# Patient Record
Sex: Male | Born: 1995 | Race: White | Hispanic: No | Marital: Single | Smoking: Former smoker
Health system: Southern US, Community
[De-identification: ages and names within clinical notes are randomized; demographics above are authoritative.]

## PROBLEM LIST (undated history)

## (undated) DIAGNOSIS — F319 Bipolar disorder, unspecified: Secondary | ICD-10-CM

## (undated) DIAGNOSIS — J069 Acute upper respiratory infection, unspecified: Secondary | ICD-10-CM

## (undated) DIAGNOSIS — J45909 Unspecified asthma, uncomplicated: Secondary | ICD-10-CM

## (undated) DIAGNOSIS — F909 Attention-deficit hyperactivity disorder, unspecified type: Secondary | ICD-10-CM

## (undated) DIAGNOSIS — T7840XA Allergy, unspecified, initial encounter: Secondary | ICD-10-CM

## (undated) HISTORY — DX: Acute upper respiratory infection, unspecified: J06.9

## (undated) HISTORY — DX: Allergy, unspecified, initial encounter: T78.40XA

---
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
## 2015-03-05 ENCOUNTER — Encounter (HOSPITAL_BASED_OUTPATIENT_CLINIC_OR_DEPARTMENT_OTHER): Payer: Self-pay

## 2015-03-05 ENCOUNTER — Emergency Department (HOSPITAL_BASED_OUTPATIENT_CLINIC_OR_DEPARTMENT_OTHER)
Admission: EM | Admit: 2015-03-05 | Discharge: 2015-03-05 | Disposition: A | Discharge status: Home / Self Care | Payer: Medicaid Other | Attending: Emergency Medicine | Admitting: Emergency Medicine

## 2015-03-05 ENCOUNTER — Emergency Department (HOSPITAL_BASED_OUTPATIENT_CLINIC_OR_DEPARTMENT_OTHER): Payer: Medicaid Other

## 2015-03-05 DIAGNOSIS — S060X1A Concussion with loss of consciousness of 30 minutes or less, initial encounter: Secondary | ICD-10-CM

## 2015-03-05 DIAGNOSIS — S022XXA Fracture of nasal bones, initial encounter for closed fracture: Secondary | ICD-10-CM

## 2015-03-05 DIAGNOSIS — Y929 Unspecified place or not applicable: Secondary | ICD-10-CM | POA: Insufficient documentation

## 2015-03-05 DIAGNOSIS — J45909 Unspecified asthma, uncomplicated: Secondary | ICD-10-CM | POA: Diagnosis not present

## 2015-03-05 DIAGNOSIS — W228XXA Striking against or struck by other objects, initial encounter: Secondary | ICD-10-CM | POA: Diagnosis not present

## 2015-03-05 DIAGNOSIS — S0990XA Unspecified injury of head, initial encounter: Secondary | ICD-10-CM | POA: Diagnosis present

## 2015-03-05 DIAGNOSIS — Z79899 Other long term (current) drug therapy: Secondary | ICD-10-CM | POA: Insufficient documentation

## 2015-03-05 DIAGNOSIS — S02401A Maxillary fracture, unspecified, initial encounter for closed fracture: Secondary | ICD-10-CM

## 2015-03-05 DIAGNOSIS — Y9383 Activity, rough housing and horseplay: Secondary | ICD-10-CM | POA: Diagnosis not present

## 2015-03-05 DIAGNOSIS — Y998 Other external cause status: Secondary | ICD-10-CM | POA: Insufficient documentation

## 2015-03-05 DIAGNOSIS — F909 Attention-deficit hyperactivity disorder, unspecified type: Secondary | ICD-10-CM | POA: Insufficient documentation

## 2015-03-05 MED ORDER — ONDANSETRON HCL 4 MG/2ML IJ SOLN
4.0000 mg | Freq: Once | INTRAMUSCULAR | Status: AC
  Administered 2015-03-05: 4 mg via INTRAVENOUS
  Filled 2015-03-05: qty 2

## 2015-03-05 MED ORDER — SODIUM CHLORIDE 0.9 % IV BOLUS (SEPSIS)
500.0000 mL | Freq: Once | INTRAVENOUS | Status: AC
  Administered 2015-03-05: 500 mL via INTRAVENOUS

## 2015-03-05 MED ORDER — FUROSEMIDE 10 MG/ML IJ SOLN: 40.0000 mg | Freq: Once | INTRAMUSCULAR | Status: DC

## 2015-03-05 MED ORDER — MORPHINE SULFATE 4 MG/ML IJ SOLN
4.0000 mg | INTRAMUSCULAR | Status: DC | PRN
  Administered 2015-03-05: 4 mg via INTRAVENOUS
  Filled 2015-03-05: qty 1

## 2015-03-05 MED ORDER — OXYCODONE-ACETAMINOPHEN 5-325 MG PO TABS
1.0000 | ORAL_TABLET | Freq: Once | ORAL | Status: AC
  Administered 2015-03-05: 1 via ORAL
  Filled 2015-03-05: qty 1

## 2015-03-05 MED ORDER — HYDROCODONE-ACETAMINOPHEN 5-325 MG PO TABS: ORAL_TABLET | ORAL | Status: DC
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
## 2015-03-05 NOTE — Discharge Instructions (Signed)
Take vicodin for breakthrough pain, do not drink alcohol, drive, care for children or do other critical tasks while taking vicodin.  Please follow with your primary care doctor in the next 2 days for a check-up. They must obtain records for further management.   Do not hesitate to return to the Emergency Department for any new, worsening or concerning symptoms.   Do not participate in any sports or any activities that could result in head trauma until you are cleared by your pediatrician,  primary care physician or neurologist.    Facial Fracture A facial fracture is a break in one of the bones of your face. HOME CARE INSTRUCTIONS   Protect the injured part of your face until it is healed.  Do not participate in activities which give chance for re-injury until your doctor approves.  Gently wash and dry your face.  Wear head and facial protection while riding a bicycle, motorcycle, or snowmobile. SEEK MEDICAL CARE IF:   An oral temperature above 102 F (38.9 C) develops.  You have severe headaches or notice changes in your vision.  You have new numbness or tingling in your face.  You develop nausea (feeling sick to your stomach), vomiting or a stiff neck. SEEK IMMEDIATE MEDICAL CARE IF:   You develop difficulty seeing or experience double vision.  You become dizzy, lightheaded, or faint.  You develop trouble speaking, breathing, or swallowing.  You have a watery discharge from your nose or ear. MAKE SURE YOU:   Understand these instructions.  Will watch your condition.  Will get help right away if you are not doing well or get worse. Document Released: 07/27/2005 Document Revised: 10/19/2011 Document Reviewed: 03/15/2008 [facility] Patient Information 2015 [place], [place]. This information is not intended to replace advice given to you by your health care provider. Make sure you discuss any questions you have with your health care provider.
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
## 2015-03-05 NOTE — ED Notes (Signed)
pa at bedside. 

## 2015-03-05 NOTE — ED Notes (Signed)
Family at bedside. 

## 2019-10-20 ENCOUNTER — Encounter (HOSPITAL_COMMUNITY): Payer: Self-pay

## 2019-10-20 ENCOUNTER — Ambulatory Visit (HOSPITAL_COMMUNITY)
Admission: EM | Admit: 2019-10-20 | Discharge: 2019-10-20 | Disposition: A | Discharge status: Home / Self Care | Payer: Medicaid Other | Attending: Physician Assistant | Admitting: Physician Assistant

## 2019-10-20 ENCOUNTER — Other Ambulatory Visit: Payer: Self-pay

## 2019-10-20 DIAGNOSIS — Z20822 Contact with and (suspected) exposure to covid-19: Secondary | ICD-10-CM | POA: Diagnosis not present

## 2019-10-20 DIAGNOSIS — F909 Attention-deficit hyperactivity disorder, unspecified type: Secondary | ICD-10-CM | POA: Diagnosis not present

## 2019-10-20 DIAGNOSIS — Z79899 Other long term (current) drug therapy: Secondary | ICD-10-CM | POA: Diagnosis not present

## 2019-10-20 DIAGNOSIS — J069 Acute upper respiratory infection, unspecified: Secondary | ICD-10-CM | POA: Diagnosis present

## 2019-10-20 DIAGNOSIS — J029 Acute pharyngitis, unspecified: Secondary | ICD-10-CM | POA: Diagnosis not present

## 2019-10-20 DIAGNOSIS — R0789 Other chest pain: Secondary | ICD-10-CM | POA: Diagnosis not present

## 2019-10-20 DIAGNOSIS — R05 Cough: Secondary | ICD-10-CM | POA: Insufficient documentation

## 2019-10-20 DIAGNOSIS — L259 Unspecified contact dermatitis, unspecified cause: Secondary | ICD-10-CM

## 2019-10-20 MED ORDER — CETIRIZINE HCL 10 MG PO TABS: 10.0000 mg | ORAL_TABLET | Freq: Every day | ORAL | 0 refills | Status: DC

## 2019-10-20 MED ORDER — ALBUTEROL SULFATE HFA 108 (90 BASE) MCG/ACT IN AERS: 2.0000 | INHALATION_SPRAY | RESPIRATORY_TRACT | 0 refills | Status: DC | PRN

## 2019-10-20 MED ORDER — BENZONATATE 100 MG PO CAPS: 100.0000 mg | ORAL_CAPSULE | Freq: Three times a day (TID) | ORAL | 0 refills | Status: DC

## 2019-10-20 NOTE — Discharge Instructions (Signed)
Take the [facility] for your cough.  You may also utilize your albuterol inhaler for chest tightness and feelings of shortness of breath every 4-6 hours.  Utilize Tylenol for fever, body aches, chills.  If you have worsening shortness of breath, wheezing, change in your productive cough and fever please return for reevaluation.  If your Covid-19 test is positive, you will receive a phone call from [facility] regarding your results. Negative test results are not called. Both positive and negative results area always visible on MyChart. If you do not have a MyChart account, sign up instructions are in your discharge papers.   Persons who are directed to care for themselves at home may discontinue isolation under the following conditions:   At least 10 days have passed since symptom onset and  At least 24 hours have passed without running a fever (this means without the use of fever-reducing medications) and  Other symptoms have improved.  Persons infected with COVID-19 who never develop symptoms may discontinue isolation and other precautions 10 days after the date of their first positive COVID-19 test.

## 2019-10-20 NOTE — ED Triage Notes (Signed)
Pt presents with productive cough , sore throat and chills since yesterday.

## 2019-10-20 NOTE — ED Provider Notes (Addendum)
[facility]    CSN: [id number] Arrival date & time: 10/20/19  1013      History   Chief Complaint Chief Complaint  Patient presents with  . Cough  . Sore Throat  . Chills    HPI Lee Ramos is a 24 y.o. male.   Patient presents for 1 day history of onset of cough, sore throat and chills.  He reports last night he began having a productive cough with clear sputum.  He also had mildly sore throat.  He also been experiencing chest tightness but denies difficulty breathing or shortness of breath.  Denies chest pain.  An episode of chills.  He denies fever.  Denies nausea, vomiting, diarrhea.  Denies known sick contacts.  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  He does endorse a history of asthma that he uses an albuterol inhaler for.  He has not had to use this increasingly in the last 2 days.     Past Medical History:  Diagnosis Date  . ADHD (attention deficit hyperactivity disorder)   . Allergy   . Allergy   . Asthma   . Bipolar 1 disorder [facility])     Patient Active Problem List   Diagnosis Date Noted  . Attention deficit disorder with hyperactivity(314.01) 11/01/2012  . Bipolar disorder, unspecified (HCC) 11/01/2012  . Unspecified asthma(493.90) 11/01/2012    History reviewed. No pertinent surgical history.     Home Medications    Prior to Admission medications   Medication Sig Start Date End Date Taking? Authorizing Provider  albuterol (VENTOLIN HFA) 108 (90 Base) MCG/ACT inhaler Inhale 2 puffs into the lungs every 4 (four) hours as needed for shortness of breath. For shortness of breath 10/20/19   Soila Printup, Veryl Speak, PA-C  benzonatate (TESSALON) 100 MG capsule Take 1 capsule (100 mg total) by mouth every 8 (eight) hours. 10/20/19   Kissa Campoy, Veryl Speak, PA-C  cetirizine (ZYRTEC) 10 MG tablet Take 1 tablet (10 mg total) by mouth daily. 10/20/19   Darragh Nay, Veryl Speak, PA-C  dexamethasone (DECADRON) 4 MG tablet Take 4 tablets (16 mg total) by mouth  once. 01/16/13   Raeford Razor, MD  diphenhydrAMINE (BENADRYL) 2 % cream Apply topically 3 (three) times daily as needed for itching. 11/01/12   Laurell Josephs, MD  guanFACINE (INTUNIV) 4 MG TB24 Take 1 tablet (4 mg total) by mouth daily. 03/03/13   Laurell Josephs, MD  HYDROcodone-acetaminophen (NORCO/VICODIN) 5-325 MG per tablet Take 1-2 tablets by mouth every 6 hours as needed for pain and/or cough. 03/05/15   Pisciotta, Joni Reining, PA-C  methylphenidate (CONCERTA) 54 MG CR tablet Take 1 tablet (54 mg total) by mouth every morning. 03/03/13   Laurell Josephs, MD    Family History Family History  Family history unknown: Yes    Social History Social History   Tobacco Use  . Smoking status: Never Smoker  Substance Use Topics  . Alcohol use: No  . Drug use: No     Allergies   Nickel   Review of Systems Review of Systems  Constitutional: Positive for chills. Negative for fever.  HENT: Positive for rhinorrhea. Negative for ear pain and sore throat.   Eyes: Negative for pain, itching and visual disturbance.  Respiratory: Positive for cough and chest tightness. Negative for shortness of breath.   Cardiovascular: Negative for chest pain and palpitations.  Gastrointestinal: Negative for abdominal pain, diarrhea, nausea and vomiting.  Musculoskeletal: Negative for arthralgias, back pain and  myalgias.  Skin: Negative for color change and rash.  Neurological: Negative for headaches.  All other systems reviewed and are negative.    Physical Exam Triage Vital Signs ED Triage Vitals  Enc Vitals Group     BP      Pulse      Resp      Temp      Temp src      SpO2      Weight      Height      Head Circumference      Peak Flow      Pain Score      Pain Loc      Pain Edu?      Excl. in GC?    No data found.  Updated Vital Signs BP 119/81 (BP Location: Right Arm)   Pulse 77   Temp 98.3 F (36.8 C) (Oral)   Resp 18   SpO2 100%   Visual Acuity Right Eye Distance:   Left  Eye Distance:   Bilateral Distance:    Right Eye Near:   Left Eye Near:    Bilateral Near:     Physical Exam Vitals and nursing note reviewed.  Constitutional:      General: He is not in acute distress.    Appearance: He is well-developed. He is not ill-appearing.  HENT:     Head: Normocephalic and atraumatic.     Right Ear: Tympanic membrane normal.     Left Ear: Tympanic membrane normal.     Mouth/Throat:     Mouth: Mucous membranes are moist.     Pharynx: Oropharynx is clear.  Eyes:     Conjunctiva/sclera: Conjunctivae normal.  Cardiovascular:     Rate and Rhythm: Normal rate and regular rhythm.     Heart sounds: No murmur.  Pulmonary:     Effort: Pulmonary effort is normal. No respiratory distress.     Breath sounds: Normal breath sounds. No wheezing, rhonchi or rales.  Abdominal:     Palpations: Abdomen is soft.     Tenderness: There is no abdominal tenderness.  Musculoskeletal:     Cervical back: Neck supple.  Skin:    General: Skin is warm and dry.  Neurological:     Mental Status: He is alert.      UC Treatments / Results  Labs (all labs ordered are listed, but only abnormal results are displayed) Labs Reviewed  SARS CORONAVIRUS 2 (TAT 6-24 HRS)    EKG   Radiology No results found.  Procedures Procedures (including critical care time)  Medications Ordered in UC Medications - No data to display  Initial Impression / Assessment and Plan / UC Course  I have reviewed the triage vital signs and the nursing notes.  Pertinent labs & imaging results that were available during my care of the patient were reviewed by me and considered in my medical decision making (see chart for details).     #URI with cough Patient is a 24 year old male presenting with viral syndrome with cough.  Covid PCR was sent.  Symptomatic care with Tessalon for cough, Tylenol for sore throat and fever.  No wheezing saturating 100% moving air well however, instructed to utilize  albuterol inhaler every 4-6 hours scheduled for chest tightness.  Ordered Zyrtec to take daily and discussed this with patient. -Patient verbalized understanding plan and work note was given. Final Clinical Impressions(s) / UC Diagnoses   Final diagnoses:  Viral URI with cough  Discharge Instructions     Take the [facility] for your cough.  You may also utilize your albuterol inhaler for chest tightness and feelings of shortness of breath every 4-6 hours.  Utilize Tylenol for fever, body aches, chills.  If you have worsening shortness of breath, wheezing, change in your productive cough and fever please return for reevaluation.  If your Covid-19 test is positive, you will receive a phone call from [facility] regarding your results. Negative test results are not called. Both positive and negative results area always visible on MyChart. If you do not have a MyChart account, sign up instructions are in your discharge papers.   Persons who are directed to care for themselves at home may discontinue isolation under the following conditions:  . At least 10 days have passed since symptom onset and . At least 24 hours have passed without running a fever (this means without the use of fever-reducing medications) and . Other symptoms have improved.  Persons infected with COVID-19 who never develop symptoms may discontinue isolation and other precautions 10 days after the date of their first positive COVID-19 test.     ED Prescriptions    Medication Sig Dispense Auth. Provider   albuterol (VENTOLIN HFA) 108 (90 Base) MCG/ACT inhaler Inhale 2 puffs into the lungs every 4 (four) hours as needed for shortness of breath. For shortness of breath 18 g Tynesha Free, Marguerita Beards, PA-C   cetirizine (ZYRTEC) 10 MG tablet Take 1 tablet (10 mg total) by mouth daily. 30 tablet Alicya Bena, Marguerita Beards, PA-C   benzonatate (TESSALON) 100 MG capsule Take 1 capsule (100 mg total) by mouth every 8 (eight) hours. 21  capsule Jedadiah Abdallah, Marguerita Beards, PA-C     PDMP not reviewed this encounter.   Purnell Shoemaker, PA-C 10/20/19 1531    Lenni Reckner, Marguerita Beards, PA-C 10/20/19 [phone]

## 2019-10-21 LAB — SARS CORONAVIRUS 2 (TAT 6-24 HRS): SARS Coronavirus 2: NEGATIVE

## 2019-12-03 ENCOUNTER — Other Ambulatory Visit: Payer: Self-pay

## 2019-12-03 ENCOUNTER — Emergency Department (HOSPITAL_COMMUNITY)
Admission: EM | Admit: 2019-12-03 | Discharge: 2019-12-03 | Disposition: A | Discharge status: Home / Self Care | Payer: Self-pay | Attending: Emergency Medicine | Admitting: Emergency Medicine

## 2019-12-03 ENCOUNTER — Encounter (HOSPITAL_COMMUNITY): Payer: Self-pay | Admitting: Emergency Medicine

## 2019-12-03 ENCOUNTER — Emergency Department (HOSPITAL_COMMUNITY): Payer: Self-pay

## 2019-12-03 DIAGNOSIS — J45909 Unspecified asthma, uncomplicated: Secondary | ICD-10-CM | POA: Insufficient documentation

## 2019-12-03 DIAGNOSIS — R079 Chest pain, unspecified: Secondary | ICD-10-CM

## 2019-12-03 DIAGNOSIS — R0789 Other chest pain: Secondary | ICD-10-CM | POA: Insufficient documentation

## 2019-12-03 LAB — BASIC METABOLIC PANEL
Anion gap: 5 (ref 5–15)
BUN: 9 mg/dL (ref 6–20)
CO2: 29 mmol/L (ref 22–32)
Calcium: 9.3 mg/dL (ref 8.9–10.3)
Chloride: 105 mmol/L (ref 98–111)
Creatinine, Ser: 0.97 mg/dL (ref 0.61–1.24)
GFR calc Af Amer: >60 mL/min (ref >60)
GFR calc non Af Amer: >60 mL/min (ref >60)
Glucose, Bld: 78 mg/dL (ref 70–99)
Potassium: 4.6 mmol/L (ref 3.5–5.1)
Sodium: 139 mmol/L (ref 135–145)

## 2019-12-03 LAB — CBC
HCT: 47.9 % (ref 39.0–52.0)
Hemoglobin: 15.4 g/dL (ref 13.0–17.0)
MCH: 29.2 pg (ref 26.0–34.0)
MCHC: 32.2 g/dL (ref 30.0–36.0)
MCV: 90.7 fL (ref 80.0–100.0)
Platelets: 286 10*3/uL (ref 150–400)
RBC: 5.28 MIL/uL (ref 4.22–5.81)
RDW: 12.5 % (ref 11.5–15.5)
WBC: 7.4 10*3/uL (ref 4.0–10.5)
nRBC: 0 % (ref 0.0–0.2)

## 2019-12-03 LAB — TROPONIN I (HIGH SENSITIVITY)
Troponin I (High Sensitivity): <2 ng/L (ref <18)
Troponin I (High Sensitivity): <2 ng/L (ref <18)

## 2019-12-03 MED ORDER — SODIUM CHLORIDE 0.9% FLUSH: 3.0000 mL | Freq: Once | INTRAVENOUS | Status: DC

## 2019-12-03 NOTE — Discharge Instructions (Signed)
Use ibuprofen or tylenol as needed for pain. Return to the ER for new or worsening symptoms.

## 2019-12-03 NOTE — ED Triage Notes (Signed)
Pt to ED via [facility] with c/o mid chest pain.  Pt st's has been under a lot of stress.  St's he was cleaning his car and was bent over and when he stood up he developed chest pain.  Pt was given ASA 324mg 

## 2019-12-03 NOTE — ED Provider Notes (Signed)
[facility] EMERGENCY DEPARTMENT Provider Note   CSN: [id number] Arrival date & time: 12/03/19  1623     History Chief Complaint  Patient presents with  . Chest Pain    Lee Ramos is a 24 y.o. male.  Patient with history of asthma presents with left-sided chest pain worse with palpation and movement since he was cleaning his car.  Patient denies any cardiac history.  Patient denies illegal drugs.  No exertional symptoms.  No history of similar.  Fairly constant and was given aspirin by EMS.  Pain has improved significantly since then.  Patient denies blood clot risk factors.        Past Medical History:  Diagnosis Date  . ADHD (attention deficit hyperactivity disorder)   . Allergy   . Allergy   . Asthma   . Bipolar 1 disorder [facility])     Patient Active Problem List   Diagnosis Date Noted  . Attention deficit disorder with hyperactivity(314.01) 11/01/2012  . Bipolar disorder, unspecified (HCC) 11/01/2012  . Unspecified asthma(493.90) 11/01/2012    History reviewed. No pertinent surgical history.     Family History  Family history unknown: Yes    Social History   Tobacco Use  . Smoking status: Never Smoker  . Smokeless tobacco: Never Used  Substance Use Topics  . Alcohol use: No  . Drug use: No    Home Medications Prior to Admission medications   Medication Sig Start Date End Date Taking? Authorizing Provider  albuterol (PROVENTIL) (2.5 MG/3ML) 0.083% nebulizer solution Take 2.5 mg by nebulization as needed for wheezing.  12/13/17  Yes [provider]  albuterol (VENTOLIN HFA) 108 (90 Base) MCG/ACT inhaler Inhale 2 puffs into the lungs every 4 (four) hours as needed for shortness of breath. For shortness of breath 10/20/19  Yes Darr, Veryl Speak, PA-C  cetirizine (ZYRTEC) 10 MG tablet Take 1 tablet (10 mg total) by mouth daily. 10/20/19  Yes Darr, Veryl Speak, PA-C  benzonatate (TESSALON) 100 MG capsule Take 1 capsule (100 mg total) by  mouth every 8 (eight) hours. Patient not taking: Reported on 12/03/2019 10/20/19   Darr, Veryl Speak, PA-C  dexamethasone (DECADRON) 4 MG tablet Take 4 tablets (16 mg total) by mouth once. Patient not taking: Reported on 12/03/2019 01/16/13   Raeford Razor, MD  diphenhydrAMINE (BENADRYL) 2 % cream Apply topically 3 (three) times daily as needed for itching. Patient not taking: Reported on 12/03/2019 11/01/12   Laurell Josephs, MD  guanFACINE (INTUNIV) 4 MG TB24 Take 1 tablet (4 mg total) by mouth daily. Patient not taking: Reported on 12/03/2019 03/03/13   Laurell Josephs, MD  HYDROcodone-acetaminophen (NORCO/VICODIN) 5-325 MG per tablet Take 1-2 tablets by mouth every 6 hours as needed for pain and/or cough. Patient not taking: Reported on 12/03/2019 03/05/15   Pisciotta, Joni Reining, PA-C  methylphenidate (CONCERTA) 54 MG CR tablet Take 1 tablet (54 mg total) by mouth every morning. Patient not taking: Reported on 12/03/2019 03/03/13   Laurell Josephs, MD    Allergies    Pepto-bismol  [bismuth] and Nickel  Review of Systems   Review of Systems  Constitutional: Negative for chills and fever.  HENT: Negative for congestion.   Eyes: Negative for visual disturbance.  Respiratory: Negative for shortness of breath.   Cardiovascular: Positive for chest pain. Negative for leg swelling.  Gastrointestinal: Negative for abdominal pain and vomiting.  Genitourinary: Negative for dysuria and flank pain.  Musculoskeletal: Negative for back pain, neck pain  and neck stiffness.  Skin: Negative for rash.  Neurological: Negative for light-headedness and headaches.    Physical Exam Updated Vital Signs BP 104/66   Pulse 75   Temp 98.6 F (37 C) (Oral)   Resp (!) 23   Ht 5\' 11"  (1.803 m)   Wt 83.9 kg   SpO2 98%   BMI 25.80 kg/m   Physical Exam Vitals and nursing note reviewed.  Constitutional:      Appearance: He is well-developed.  HENT:     Head: Normocephalic and atraumatic.  Eyes:     General:         Right eye: No discharge.        Left eye: No discharge.     Conjunctiva/sclera: Conjunctivae normal.  Neck:     Trachea: No tracheal deviation.  Cardiovascular:     Rate and Rhythm: Normal rate and regular rhythm.  Pulmonary:     Effort: Pulmonary effort is normal.     Breath sounds: Normal breath sounds.  Abdominal:     General: There is no distension.     Palpations: Abdomen is soft.     Tenderness: There is no abdominal tenderness. There is no guarding.  Musculoskeletal:     Cervical back: Normal range of motion and neck supple.     Comments: Tender to palpation left anterior ribs  Skin:    General: Skin is warm.     Findings: No rash.  Neurological:     Mental Status: He is alert and oriented to person, place, and time.     ED Results / Procedures / Treatments   Labs (all labs ordered are listed, but only abnormal results are displayed) Labs Reviewed  BASIC METABOLIC PANEL  CBC  TROPONIN I (HIGH SENSITIVITY)  TROPONIN I (HIGH SENSITIVITY)    EKG EKG Interpretation  Date/Time:  Sunday December 03 2019 16:32:08 EDT Ventricular Rate:  72 PR Interval:  136 QRS Duration: 88 QT Interval:  360 QTC Calculation: 394 R Axis:   66 Text Interpretation: Normal sinus rhythm Early repolarization Normal ECG Confirmed by Elnora Morrison [id number]) on 12/03/2019 9:17:23 PM   Radiology DG Chest 2 View  Result Date: 12/03/2019 CLINICAL DATA:  Chest pain EXAM: CHEST - 2 VIEW COMPARISON:  06/30/2011 FINDINGS: The heart size and mediastinal contours are within normal limits. Both lungs are clear. The visualized skeletal structures are unremarkable. IMPRESSION: No acute abnormality of the lungs. Electronically Signed   By: Eddie Candle M.D.   On: 12/03/2019 17:10    Procedures Procedures (including critical care time)  Medications Ordered in ED Medications  sodium chloride flush (NS) 0.9 % injection 3 mL (3 mLs Intravenous Not Given 12/03/19 2114)    ED Course  I have reviewed the  triage vital signs and the nursing notes.  Pertinent labs & imaging results that were available during my care of the patient were reviewed by me and considered in my medical decision making (see chart for details).    MDM Rules/Calculators/A&P                      Patient presents with atypical chest pain likely musculoskeletal.  Patient is low risk ACS and very low risk blood clots.  General blood work ordered and reviewed normal white blood cell count, normal kidney function, normal initial troponin.  Delay in second troponin discussed with nursing staff.  Mother and patient wished to leave before second troponin result.  Patient does have capacity  make decisions and is very low risk at discharge.  Follow-up outpatient have been discussed.  EKG diffuse J-point elevation.  Chest x-ray reviewed no acute abnormalities.  Lee Ramos was evaluated in Emergency Department on 12/04/2019 for the symptoms described in the history of present illness. He was evaluated in the context of the global COVID-19 pandemic, which necessitated consideration that the patient might be at risk for infection with the SARS-CoV-2 virus that causes COVID-19. Institutional protocols and algorithms that pertain to the evaluation of patients at risk for COVID-19 are in a state of rapid change based on information released by regulatory bodies including the CDC and federal and state organizations. These policies and algorithms were followed during the patient's care in the ED.  Final Clinical Impression(s) / ED Diagnoses Final diagnoses:  Acute chest pain    Rx / DC Orders ED Discharge Orders    None       Blane Ohara, MD 12/04/19 [phone]

## 2021-06-18 ENCOUNTER — Other Ambulatory Visit: Payer: Self-pay

## 2021-06-18 ENCOUNTER — Emergency Department (HOSPITAL_COMMUNITY)
Admission: EM | Admit: 2021-06-18 | Discharge: 2021-06-18 | Disposition: A | Discharge status: Home / Self Care | Payer: Medicaid Other | Attending: Emergency Medicine | Admitting: Emergency Medicine

## 2021-06-18 ENCOUNTER — Encounter (HOSPITAL_COMMUNITY): Payer: Self-pay | Admitting: Emergency Medicine

## 2021-06-18 DIAGNOSIS — R0789 Other chest pain: Secondary | ICD-10-CM | POA: Insufficient documentation

## 2021-06-18 DIAGNOSIS — R079 Chest pain, unspecified: Secondary | ICD-10-CM | POA: Diagnosis present

## 2021-06-18 DIAGNOSIS — Z87891 Personal history of nicotine dependence: Secondary | ICD-10-CM | POA: Insufficient documentation

## 2021-06-18 DIAGNOSIS — J45909 Unspecified asthma, uncomplicated: Secondary | ICD-10-CM | POA: Diagnosis not present

## 2021-06-18 MED ORDER — IBUPROFEN 800 MG PO TABS: 800.0000 mg | ORAL_TABLET | Freq: Four times a day (QID) | ORAL | 0 refills | Status: DC | PRN

## 2021-06-18 MED ORDER — METHOCARBAMOL 500 MG PO TABS: 500.0000 mg | ORAL_TABLET | Freq: Three times a day (TID) | ORAL | 0 refills | Status: DC | PRN

## 2021-06-18 MED ORDER — DEXAMETHASONE SODIUM PHOSPHATE 10 MG/ML IJ SOLN
10.0000 mg | Freq: Once | INTRAMUSCULAR | Status: AC
  Administered 2021-06-18: 10 mg via INTRAMUSCULAR
  Filled 2021-06-18: qty 1

## 2021-06-18 MED ORDER — KETOROLAC TROMETHAMINE 60 MG/2ML IM SOLN
60.0000 mg | Freq: Once | INTRAMUSCULAR | Status: AC
  Administered 2021-06-18: 60 mg via INTRAMUSCULAR
  Filled 2021-06-18: qty 2

## 2021-06-18 NOTE — ED Provider Notes (Signed)
[facility] EMERGENCY DEPARTMENT Provider Note   CSN: [id number] Arrival date & time: 06/18/21  0554     History Chief Complaint  Patient presents with   Chest Pain    Lee Ramos is a 25 y.o. male.  Patient presents to the emergency department for evaluation of chest pain.  Patient reports that he has been noticing central chest pain for 2 weeks.  Pain seems to have been coming and going.  For the last 10 hours or so, however, the pain has been constant.  Pain is in the center of the chest.  He feels a knot in the area.  Patient reports that it hurts to bend, twist and touch the area.  He denies injury.  No shortness of breath.      Past Medical History:  Diagnosis Date   ADHD (attention deficit hyperactivity disorder)    Allergy    Allergy    Asthma    Bipolar 1 disorder [place])     Patient Active Problem List   Diagnosis Date Noted   Attention deficit disorder with hyperactivity(314.01) 11/01/2012   Bipolar disorder, unspecified (HCC) 11/01/2012   Unspecified asthma(493.90) 11/01/2012    History reviewed. No pertinent surgical history.     Family History  Family history unknown: Yes    Social History   Tobacco Use   Smoking status: Former    Types: Cigarettes   Smokeless tobacco: Never  Substance Use Topics   Alcohol use: Yes    Comment: occ   Drug use: No    Home Medications Prior to Admission medications   Medication Sig Start Date End Date Taking? Authorizing Provider  ibuprofen (ADVIL) 800 MG tablet Take 1 tablet (800 mg total) by mouth every 6 (six) hours as needed for moderate pain. 06/18/21  Yes Christena Sunderlin, Canary Brim, MD  methocarbamol (ROBAXIN) 500 MG tablet Take 1 tablet (500 mg total) by mouth every 8 (eight) hours as needed for muscle spasms. 06/18/21  Yes Mai Longnecker, Canary Brim, MD  albuterol (PROVENTIL) (2.5 MG/3ML) 0.083% nebulizer solution Take 2.5 mg by nebulization as needed for wheezing.  12/13/17   [provider]  albuterol  (VENTOLIN HFA) 108 (90 Base) MCG/ACT inhaler Inhale 2 puffs into the lungs every 4 (four) hours as needed for shortness of breath. For shortness of breath 10/20/19   Darr, Gerilyn Pilgrim, PA-C  benzonatate (TESSALON) 100 MG capsule Take 1 capsule (100 mg total) by mouth every 8 (eight) hours. Patient not taking: Reported on 12/03/2019 10/20/19   Darr, Gerilyn Pilgrim, PA-C  cetirizine (ZYRTEC) 10 MG tablet Take 1 tablet (10 mg total) by mouth daily. 10/20/19   Darr, Gerilyn Pilgrim, PA-C  dexamethasone (DECADRON) 4 MG tablet Take 4 tablets (16 mg total) by mouth once. Patient not taking: Reported on 12/03/2019 01/16/13   Raeford Razor, MD  diphenhydrAMINE (BENADRYL) 2 % cream Apply topically 3 (three) times daily as needed for itching. Patient not taking: Reported on 12/03/2019 11/01/12   Laurell Josephs, MD  guanFACINE (INTUNIV) 4 MG TB24 Take 1 tablet (4 mg total) by mouth daily. Patient not taking: Reported on 12/03/2019 03/03/13   Laurell Josephs, MD  HYDROcodone-acetaminophen (NORCO/VICODIN) 5-325 MG per tablet Take 1-2 tablets by mouth every 6 hours as needed for pain and/or cough. Patient not taking: Reported on 12/03/2019 03/05/15   Pisciotta, Joni Reining, PA-C  methylphenidate (CONCERTA) 54 MG CR tablet Take 1 tablet (54 mg total) by mouth every morning. Patient not taking: Reported on 12/03/2019 03/03/13   Martyn Ehrich  A, MD    Allergies    Pepto-bismol  [bismuth] and Nickel  Review of Systems   Review of Systems  Cardiovascular:  Positive for chest pain.  All other systems reviewed and are negative.  Physical Exam Updated Vital Signs BP 109/71   Pulse 62   Temp 98 F (36.7 C) (Oral)   Resp 16   Ht 5\' 11"  (1.803 m)   Wt 79.4 kg   SpO2 100%   BMI 24.41 kg/m   Physical Exam Vitals and nursing note reviewed.  Constitutional:      General: He is not in acute distress.    Appearance: Normal appearance. He is well-developed.  HENT:     Head: Normocephalic and atraumatic.     Right Ear: Hearing normal.     Left  Ear: Hearing normal.     Nose: Nose normal.  Eyes:     Conjunctiva/sclera: Conjunctivae normal.     Pupils: Pupils are equal, round, and reactive to light.  Cardiovascular:     Rate and Rhythm: Regular rhythm.     Heart sounds: S1 normal and S2 normal. No murmur heard.   No friction rub. No gallop.  Pulmonary:     Effort: Pulmonary effort is normal. No respiratory distress.     Breath sounds: Normal breath sounds.  Chest:     Chest wall: Tenderness present.    Abdominal:     General: Bowel sounds are normal.     Palpations: Abdomen is soft.     Tenderness: There is no abdominal tenderness. There is no guarding or rebound. Negative signs include Murphy's sign and McBurney's sign.     Hernia: No hernia is present.  Musculoskeletal:        General: Normal range of motion.     Cervical back: Normal range of motion and neck supple.  Skin:    General: Skin is warm and dry.     Findings: No rash.  Neurological:     Mental Status: He is alert and oriented to person, place, and time.     GCS: GCS eye subscore is 4. GCS verbal subscore is 5. GCS motor subscore is 6.     Cranial Nerves: No cranial nerve deficit.     Sensory: No sensory deficit.     Coordination: Coordination normal.  Psychiatric:        Speech: Speech normal.        Behavior: Behavior normal.        Thought Content: Thought content normal.    ED Results / Procedures / Treatments   Labs (all labs ordered are listed, but only abnormal results are displayed) Labs Reviewed - No data to display  EKG EKG Interpretation  Date/Time:  Wednesday June 18 2021 06:09:52 EST Ventricular Rate:  71 PR Interval:  156 QRS Duration: 95 QT Interval:  391 QTC Calculation: 425 R Axis:   53 Text Interpretation: Sinus rhythm Benign early repolarization No significant change since last tracing Confirmed by 1970 [phone]) on 06/18/2021 6:14:35 AM  Radiology No results found.  Procedures Procedures    Medications Ordered in ED Medications  dexamethasone (DECADRON) injection 10 mg (has no administration in time range)  ketorolac (TORADOL) injection 60 mg (has no administration in time range)    ED Course  I have reviewed the triage vital signs and the nursing notes.  Pertinent labs & imaging results that were available during my care of the patient were reviewed by me and considered in  my medical decision making (see chart for details).    MDM Rules/Calculators/A&P                           Patient presents to the emergency department for evaluation of chest pain.  Symptoms ongoing for 2 weeks.  Patient with reproducible central chest pain and tenderness.  Reviewing his records reveals a visit 1 year ago that was with identical symptoms.  His cardiac work-up was negative at that time.  EKG with benign early repolarization, unchanged from prior.  This presentation is not consistent with acute coronary syndrome or coronary artery disease as an etiology for his chest pain.  I do not feel he requires further work-up.  Presentation is very consistent with musculoskeletal chest pain, similar to prior.  We will treat empirically.  Final Clinical Impression(s) / ED Diagnoses Final diagnoses:  Chest wall pain    Rx / DC Orders ED Discharge Orders          Ordered    ibuprofen (ADVIL) 800 MG tablet  Every 6 hours PRN        06/18/21 0615    methocarbamol (ROBAXIN) 500 MG tablet  Every 8 hours PRN        06/18/21 0615             Gilda Crease, MD 06/18/21 [phone]

## 2021-06-18 NOTE — ED Triage Notes (Signed)
Pt c/o intermittent chest pain x 2 weeks, but constant pain x 10 hours.

## 2021-07-20 ENCOUNTER — Other Ambulatory Visit: Payer: Self-pay

## 2021-07-20 ENCOUNTER — Encounter: Payer: Self-pay | Admitting: Emergency Medicine

## 2021-07-20 ENCOUNTER — Ambulatory Visit
Admission: EM | Admit: 2021-07-20 | Discharge: 2021-07-20 | Disposition: A | Discharge status: Home / Self Care | Payer: Medicaid Other | Attending: Urgent Care | Admitting: Urgent Care

## 2021-07-20 DIAGNOSIS — R52 Pain, unspecified: Secondary | ICD-10-CM | POA: Diagnosis not present

## 2021-07-20 DIAGNOSIS — R052 Subacute cough: Secondary | ICD-10-CM

## 2021-07-20 DIAGNOSIS — B349 Viral infection, unspecified: Secondary | ICD-10-CM

## 2021-07-20 DIAGNOSIS — J4540 Moderate persistent asthma, uncomplicated: Secondary | ICD-10-CM | POA: Diagnosis not present

## 2021-07-20 MED ORDER — ALBUTEROL SULFATE HFA 108 (90 BASE) MCG/ACT IN AERS: 2.0000 | INHALATION_SPRAY | RESPIRATORY_TRACT | 0 refills | Status: DC | PRN

## 2021-07-20 MED ORDER — METHYLPREDNISOLONE SODIUM SUCC 125 MG IJ SOLR
125.0000 mg | Freq: Once | INTRAMUSCULAR | Status: AC
  Administered 2021-07-20: 125 mg via INTRAMUSCULAR

## 2021-07-20 MED ORDER — PREDNISONE 20 MG PO TABS: ORAL_TABLET | ORAL | 0 refills | Status: DC

## 2021-07-20 MED ORDER — BENZONATATE 100 MG PO CAPS: 100.0000 mg | ORAL_CAPSULE | Freq: Three times a day (TID) | ORAL | 0 refills | Status: DC | PRN

## 2021-07-20 MED ORDER — PROMETHAZINE-DM 6.25-15 MG/5ML PO SYRP: 5.0000 mL | ORAL_SOLUTION | Freq: Every evening | ORAL | 0 refills | Status: DC | PRN

## 2021-07-20 NOTE — ED Provider Notes (Signed)
[facility]   MRN: [id number] DOB: Feb 16, 1996  Subjective:   Lee DEFINA is a 25 y.o. male presenting for 5-day history of acute onset persistent and worsening productive cough, chest pain, shortness of breath and wheezing.  He is also had moderate to severe body aches, chills, clamminess in his hands.  Has a history of asthma, has been using his breathing treatments and albuterol with very temporary relief.  Has also used multiple over-the-counter medications.  Has not had any COVID vaccinations.  He did some COVID test at home and was negative.  Patient is not a smoker.  No current facility-administered medications for this encounter.  Current Outpatient Medications:    albuterol (PROVENTIL) (2.5 MG/3ML) 0.083% nebulizer solution, Take 2.5 mg by nebulization as needed for wheezing. , Disp: , Rfl:    albuterol (VENTOLIN HFA) 108 (90 Base) MCG/ACT inhaler, Inhale 2 puffs into the lungs every 4 (four) hours as needed for shortness of breath. For shortness of breath, Disp: 18 g, Rfl: 0   benzonatate (TESSALON) 100 MG capsule, Take 1 capsule (100 mg total) by mouth every 8 (eight) hours. (Patient not taking: Reported on 12/03/2019), Disp: 21 capsule, Rfl: 0   cetirizine (ZYRTEC) 10 MG tablet, Take 1 tablet (10 mg total) by mouth daily., Disp: 30 tablet, Rfl: 0   dexamethasone (DECADRON) 4 MG tablet, Take 4 tablets (16 mg total) by mouth once. (Patient not taking: Reported on 12/03/2019), Disp: 4 tablet, Rfl: 0   diphenhydrAMINE (BENADRYL) 2 % cream, Apply topically 3 (three) times daily as needed for itching. (Patient not taking: Reported on 12/03/2019), Disp: 30 g, Rfl: 0   guanFACINE (INTUNIV) 4 MG TB24, Take 1 tablet (4 mg total) by mouth daily. (Patient not taking: Reported on 12/03/2019), Disp: 30 tablet, Rfl: 0   HYDROcodone-acetaminophen (NORCO/VICODIN) 5-325 MG per tablet, Take 1-2 tablets by mouth every 6 hours as needed for pain and/or cough. (Patient not taking: Reported  on 12/03/2019), Disp: 13 tablet, Rfl: 0   ibuprofen (ADVIL) 800 MG tablet, Take 1 tablet (800 mg total) by mouth every 6 (six) hours as needed for moderate pain., Disp: 20 tablet, Rfl: 0   methocarbamol (ROBAXIN) 500 MG tablet, Take 1 tablet (500 mg total) by mouth every 8 (eight) hours as needed for muscle spasms., Disp: 20 tablet, Rfl: 0   methylphenidate (CONCERTA) 54 MG CR tablet, Take 1 tablet (54 mg total) by mouth every morning. (Patient not taking: Reported on 12/03/2019), Disp: 30 tablet, Rfl: 0   Allergies  Allergen Reactions   Pepto-Bismol  [Bismuth] Hives   Nickel Rash    Past Medical History:  Diagnosis Date   ADHD (attention deficit hyperactivity disorder)    Allergy    Allergy    Asthma    Bipolar 1 disorder (HCC)      No past surgical history on file.  Family History  Family history unknown: Yes    Social History   Tobacco Use   Smoking status: Former    Types: Cigarettes   Smokeless tobacco: Never  Substance Use Topics   Alcohol use: Yes    Comment: occ   Drug use: No    ROS   Objective:   Vitals: BP 133/73 (BP Location: Right Arm)   Pulse (!) 106   Temp 97.6 F (36.4 C) (Oral)   Ht 5\' 11"  (1.803 m)   Wt 175 lb (79.4 kg)   SpO2 98%   BMI 24.41 kg/m   Physical Exam Constitutional:  General: He is not in acute distress.    Appearance: Normal appearance. He is well-developed. He is not ill-appearing, toxic-appearing or diaphoretic.  HENT:     Head: Normocephalic and atraumatic.     Right Ear: External ear normal.     Left Ear: External ear normal.     Nose: Nose normal.     Mouth/Throat:     Mouth: Mucous membranes are moist.     Pharynx: Oropharynx is clear.  Eyes:     General: No scleral icterus.    Extraocular Movements: Extraocular movements intact.     Pupils: Pupils are equal, round, and reactive to light.  Cardiovascular:     Rate and Rhythm: Normal rate and regular rhythm.     Heart sounds: Normal heart sounds. No murmur  heard.   No friction rub. No gallop.  Pulmonary:     Effort: Pulmonary effort is normal. No respiratory distress.     Breath sounds: Normal breath sounds. No stridor. No wheezing, rhonchi or rales.  Neurological:     Mental Status: He is alert and oriented to person, place, and time.  Psychiatric:        Mood and Affect: Mood normal.        Behavior: Behavior normal.        Thought Content: Thought content normal.    Assessment and Plan :   PDMP not reviewed this encounter.  1. Acute viral syndrome   2. Body aches   3. Moderate persistent asthma without complication   4. Subacute cough    Deferred imaging given clear cardiopulmonary exam, hemodynamically stable vital signs.  High suspicion for influenza, COVID and flu test pending.  Should he test positive for influenza he would be a good candidate to undergo a course of Tamiflu as he is high risk patient with his asthma.  IM Solu-Medrol in clinic, follow this with an oral prednisone course tomorrow.  Refilled his albuterol inhaler.  Use supportive care otherwise. Counseled patient on potential for adverse effects with medications prescribed/recommended today, ER and return-to-clinic precautions discussed, patient verbalized understanding.    Wallis Bamberg, PA-C 07/20/21 1112

## 2021-07-20 NOTE — Discharge Instructions (Addendum)
We will notify you of your test results as they arrive and may take between 48-72 hours.  I encourage you to sign up for MyChart if you have not already done so as this can be the easiest way for [place] to communicate results to you online or through a phone app.  Generally, we only contact you if it is a positive test result.  In the meantime, if you develop worsening symptoms including fever, chest pain, shortness of breath despite our current treatment plan then please report to the emergency room as this may be a sign of worsening status from possible viral infection.  Otherwise, we will manage this as a viral syndrome. For sore throat or cough try using a honey-based tea. Use 3 teaspoons of honey with juice squeezed from half lemon. Place shaved pieces of ginger into 1/2-1 cup of water and warm over stove top. Then mix the ingredients and repeat every 4 hours as needed. Please take Tylenol 500mg -650mg  every 6 hours for aches and pains, fevers. Hydrate very well with at least 2 liters of water. Eat light meals such as soups to replenish electrolytes and soft fruits, veggies. Start an antihistamine like Zyrtec for postnasal drainage, sinus congestion.  Start the prednisone course tomorrow. Use the cough medications as needed. If you test positive for influenza, you should get a script for Tamiflu which we can do.

## 2021-07-20 NOTE — ED Triage Notes (Signed)
Patient c/o productive cough, body aches and chills, chest discomfort from cough x 5 days.  Patient has taken Mucinex, Claritin, Robitussin, Advil Cold and Sinus, Nyquil, Alka-Seltzer.  Patient is not vaccinated for COVID.

## 2021-07-21 LAB — COVID-19, FLU A+B NAA
Influenza A, NAA: NOT DETECTED
Influenza B, NAA: NOT DETECTED
SARS-CoV-2, NAA: NOT DETECTED

## 2021-09-04 ENCOUNTER — Ambulatory Visit (INDEPENDENT_AMBULATORY_CARE_PROVIDER_SITE_OTHER): Payer: Medicaid Other

## 2021-09-04 ENCOUNTER — Encounter: Payer: Self-pay | Admitting: Emergency Medicine

## 2021-09-04 ENCOUNTER — Ambulatory Visit
Admission: EM | Admit: 2021-09-04 | Discharge: 2021-09-04 | Disposition: A | Discharge status: Home / Self Care | Payer: Medicaid Other | Attending: Urgent Care | Admitting: Urgent Care

## 2021-09-04 ENCOUNTER — Other Ambulatory Visit: Payer: Self-pay

## 2021-09-04 DIAGNOSIS — J4540 Moderate persistent asthma, uncomplicated: Secondary | ICD-10-CM

## 2021-09-04 DIAGNOSIS — L259 Unspecified contact dermatitis, unspecified cause: Secondary | ICD-10-CM

## 2021-09-04 DIAGNOSIS — R058 Other specified cough: Secondary | ICD-10-CM

## 2021-09-04 DIAGNOSIS — R0602 Shortness of breath: Secondary | ICD-10-CM | POA: Diagnosis not present

## 2021-09-04 DIAGNOSIS — R059 Cough, unspecified: Secondary | ICD-10-CM | POA: Diagnosis not present

## 2021-09-04 DIAGNOSIS — R0989 Other specified symptoms and signs involving the circulatory and respiratory systems: Secondary | ICD-10-CM | POA: Diagnosis not present

## 2021-09-04 DIAGNOSIS — Z9189 Other specified personal risk factors, not elsewhere classified: Secondary | ICD-10-CM

## 2021-09-04 DIAGNOSIS — B349 Viral infection, unspecified: Secondary | ICD-10-CM | POA: Diagnosis not present

## 2021-09-04 MED ORDER — PROMETHAZINE-DM 6.25-15 MG/5ML PO SYRP: 5.0000 mL | ORAL_SOLUTION | Freq: Every evening | ORAL | 0 refills | Status: DC | PRN

## 2021-09-04 MED ORDER — BENZONATATE 100 MG PO CAPS: 100.0000 mg | ORAL_CAPSULE | Freq: Three times a day (TID) | ORAL | 0 refills | Status: DC | PRN

## 2021-09-04 MED ORDER — CETIRIZINE HCL 10 MG PO TABS: 10.0000 mg | ORAL_TABLET | Freq: Every day | ORAL | 0 refills | Status: DC

## 2021-09-04 MED ORDER — PREDNISONE 20 MG PO TABS
ORAL_TABLET | ORAL | 0 refills | Status: DC
Start: 2021-09-04 — End: 2021-11-26

## 2021-09-04 NOTE — ED Provider Notes (Signed)
[facility]   MRN: [id number] DOB: 26-Oct-1995  Subjective:   Lee Ramos is a 26 y.o. male presenting for 4-day history of acute onset recurrent productive cough, shortness of breath and thoracic back pain.  He is also been very congested.  Was seen 1.5 months ago for similar symptoms.  Tested negative for COVID and flu then.  He underwent a steroid course with some improvement.  He did eventually have complete resolution of his symptoms but is now presenting with the same stuff and this past week.  He did have COVID exposure with his mother who was hospitalized for COVID-pneumonia.  He does have a history of asthma and uses his breathing treatments.  He is not a smoker, does not vape.  No current facility-administered medications for this encounter.  Current Outpatient Medications:    albuterol (PROVENTIL) (2.5 MG/3ML) 0.083% nebulizer solution, Take 2.5 mg by nebulization as needed for wheezing. , Disp: , Rfl:    albuterol (VENTOLIN HFA) 108 (90 Base) MCG/ACT inhaler, Inhale 2 puffs into the lungs every 4 (four) hours as needed for shortness of breath. For shortness of breath, Disp: 18 g, Rfl: 0   benzonatate (TESSALON) 100 MG capsule, Take 1-2 capsules (100-200 mg total) by mouth 3 (three) times daily as needed for cough., Disp: 60 capsule, Rfl: 0   cetirizine (ZYRTEC) 10 MG tablet, Take 1 tablet (10 mg total) by mouth daily., Disp: 30 tablet, Rfl: 0   guanFACINE (INTUNIV) 4 MG TB24, Take 1 tablet (4 mg total) by mouth daily. (Patient not taking: Reported on 12/03/2019), Disp: 30 tablet, Rfl: 0   HYDROcodone-acetaminophen (NORCO/VICODIN) 5-325 MG per tablet, Take 1-2 tablets by mouth every 6 hours as needed for pain and/or cough. (Patient not taking: Reported on 12/03/2019), Disp: 13 tablet, Rfl: 0   ibuprofen (ADVIL) 800 MG tablet, Take 1 tablet (800 mg total) by mouth every 6 (six) hours as needed for moderate pain., Disp: 20 tablet, Rfl: 0   methocarbamol (ROBAXIN) 500 MG  tablet, Take 1 tablet (500 mg total) by mouth every 8 (eight) hours as needed for muscle spasms., Disp: 20 tablet, Rfl: 0   methylphenidate (CONCERTA) 54 MG CR tablet, Take 1 tablet (54 mg total) by mouth every morning. (Patient not taking: Reported on 12/03/2019), Disp: 30 tablet, Rfl: 0   predniSONE (DELTASONE) 20 MG tablet, Take 2 tablets daily with breakfast., Disp: 10 tablet, Rfl: 0   promethazine-dextromethorphan (PROMETHAZINE-DM) 6.25-15 MG/5ML syrup, Take 5 mLs by mouth at bedtime as needed for cough., Disp: 100 mL, Rfl: 0   Allergies  Allergen Reactions   Pepto-Bismol  [Bismuth] Hives   Nickel Rash    Past Medical History:  Diagnosis Date   ADHD (attention deficit hyperactivity disorder)    Allergy    Allergy    Asthma    Bipolar 1 disorder (HCC)      History reviewed. No pertinent surgical history.  Family History  Problem Relation Age of Onset   Fibromyalgia Mother    Hypertension Father     Social History   Tobacco Use   Smoking status: Former    Types: Cigarettes   Smokeless tobacco: Never  Substance Use Topics   Alcohol use: Yes    Comment: occ   Drug use: No    ROS   Objective:   Vitals: BP 122/73 (BP Location: Right Arm)    Pulse 98    Temp 98.1 F (36.7 C) (Oral)    Resp 18  SpO2 98%   Physical Exam Constitutional:      General: He is not in acute distress.    Appearance: Normal appearance. He is well-developed. He is not ill-appearing, toxic-appearing or diaphoretic.  HENT:     Head: Normocephalic and atraumatic.     Right Ear: External ear normal.     Left Ear: External ear normal.     Nose: Nose normal.     Mouth/Throat:     Mouth: Mucous membranes are moist.  Eyes:     General: No scleral icterus.       Right eye: No discharge.        Left eye: No discharge.     Extraocular Movements: Extraocular movements intact.  Cardiovascular:     Rate and Rhythm: Normal rate and regular rhythm.     Heart sounds: Normal heart sounds. No  murmur heard.   No friction rub. No gallop.  Pulmonary:     Effort: Pulmonary effort is normal. No respiratory distress.     Breath sounds: No stridor. Rhonchi (left mid-lower lung fields) present. No wheezing or rales.  Neurological:     Mental Status: He is alert and oriented to person, place, and time.  Psychiatric:        Mood and Affect: Mood normal.        Behavior: Behavior normal.        Thought Content: Thought content normal.    DG Chest 2 View  Result Date: 09/04/2021 CLINICAL DATA:  Shortness of breath, productive cough. EXAM: CHEST - 2 VIEW COMPARISON:  December 03, 2019. FINDINGS: The heart size and mediastinal contours are within normal limits. Both lungs are clear. The visualized skeletal structures are unremarkable. IMPRESSION: No active cardiopulmonary disease. Electronically Signed   By: Lupita Raider M.D.   On: 09/04/2021 09:48     Assessment and Plan :   PDMP not reviewed this encounter.  1. Acute viral syndrome   2. At increased risk of exposure to COVID-19 virus   3. Productive cough   4. Rhonchi   5. Moderate persistent asthma without complication    Recommended an oral prednisone course given his lung sounds, history of asthma.  Maintain albuterol treatments.  COVID-19 testing pending.  Unfortunately, he would be beyond the 5-day window for COVID antivirals.  Recommend supportive care. Counseled patient on potential for adverse effects with medications prescribed/recommended today, ER and return-to-clinic precautions discussed, patient verbalized understanding.    Wallis Bamberg, PA-C 09/04/21 1004

## 2021-09-04 NOTE — ED Triage Notes (Signed)
Feels SOB, productive cough with yellow sputum.  Symptoms started on Monday.

## 2021-09-05 LAB — COVID-19, FLU A+B NAA
Influenza A, NAA: NOT DETECTED
Influenza B, NAA: NOT DETECTED
SARS-CoV-2, NAA: NOT DETECTED

## 2021-10-20 ENCOUNTER — Ambulatory Visit
Admission: EM | Admit: 2021-10-20 | Discharge: 2021-10-20 | Disposition: A | Discharge status: Home / Self Care | Payer: Medicaid Other | Attending: Urgent Care | Admitting: Urgent Care

## 2021-10-20 ENCOUNTER — Ambulatory Visit (INDEPENDENT_AMBULATORY_CARE_PROVIDER_SITE_OTHER): Payer: PRIVATE HEALTH INSURANCE

## 2021-10-20 ENCOUNTER — Other Ambulatory Visit: Payer: Self-pay

## 2021-10-20 DIAGNOSIS — M79641 Pain in right hand: Secondary | ICD-10-CM

## 2021-10-20 DIAGNOSIS — W2209XA Striking against other stationary object, initial encounter: Secondary | ICD-10-CM | POA: Diagnosis not present

## 2021-10-20 DIAGNOSIS — S60221A Contusion of right hand, initial encounter: Secondary | ICD-10-CM

## 2021-10-20 DIAGNOSIS — Z23 Encounter for immunization: Secondary | ICD-10-CM | POA: Diagnosis not present

## 2021-10-20 DIAGNOSIS — M25531 Pain in right wrist: Secondary | ICD-10-CM | POA: Diagnosis not present

## 2021-10-20 MED ORDER — NAPROXEN 500 MG PO TABS: 500.0000 mg | ORAL_TABLET | Freq: Two times a day (BID) | ORAL | 0 refills | Status: DC

## 2021-10-20 MED ORDER — TETANUS-DIPHTH-ACELL PERTUSSIS 5-2.5-18.5 LF-MCG/0.5 IM SUSY
0.5000 mL | PREFILLED_SYRINGE | Freq: Once | INTRAMUSCULAR | Status: AC
  Administered 2021-10-20: 0.5 mL via INTRAMUSCULAR
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
## 2021-11-26 ENCOUNTER — Ambulatory Visit (INDEPENDENT_AMBULATORY_CARE_PROVIDER_SITE_OTHER): Payer: PRIVATE HEALTH INSURANCE

## 2021-11-26 ENCOUNTER — Encounter: Payer: Self-pay | Admitting: Family Medicine

## 2021-11-26 ENCOUNTER — Ambulatory Visit: Payer: PRIVATE HEALTH INSURANCE | Admitting: Family Medicine

## 2021-11-26 VITALS — BP 123/78 | HR 61 | Temp 98.5°F | Ht 71.0 in | Wt 189.4 lb

## 2021-11-26 DIAGNOSIS — F902 Attention-deficit hyperactivity disorder, combined type: Secondary | ICD-10-CM

## 2021-11-26 DIAGNOSIS — G8929 Other chronic pain: Secondary | ICD-10-CM

## 2021-11-26 DIAGNOSIS — J4520 Mild intermittent asthma, uncomplicated: Secondary | ICD-10-CM | POA: Diagnosis not present

## 2021-11-26 DIAGNOSIS — R0683 Snoring: Secondary | ICD-10-CM

## 2021-11-26 DIAGNOSIS — F319 Bipolar disorder, unspecified: Secondary | ICD-10-CM | POA: Diagnosis not present

## 2021-11-26 DIAGNOSIS — M25561 Pain in right knee: Secondary | ICD-10-CM

## 2021-11-26 MED ORDER — ALBUTEROL SULFATE HFA 108 (90 BASE) MCG/ACT IN AERS: 1.0000 | INHALATION_SPRAY | Freq: Four times a day (QID) | RESPIRATORY_TRACT | 3 refills | Status: DC | PRN

## 2021-11-26 NOTE — Progress Notes (Signed)
? ?New Patient Office Visit ? ?Subjective   ? ?Patient ID: Lee Ramos, male    DOB: 01/16/1996  Age: 26 y.o. MRN: [id number] ? ?CC:  ?Chief Complaint  ?Patient presents with  ? New Patient (Initial Visit)  ? ? ?HPI ?Lee Siimothy S Pendergrass presents to establish care. He has a history of bipolar disorder. He has not been on medication for this in over 5 years. He denies mania but does report irritation and difficulty managing his anger. He also has not had medication for ADHD treatment in months.  ? ?He is concerned about sleep apnea. He reports that he snores loudly and has since he was a teenager. His girlfriend has told him that he gasps and stops breathing in his sleep. Reports a family hx of OSA. He also struggles to stay awake during the day if he sits down.  ? ?Has a history of asthma. Reports only occasional use of his albuterol inhaler. He does need refills today.  ? ?He has had chronic pain in his right knee for years. Reports injuries as teenager but no recent injuries. The pain is intermittent, achy, and worse with activity. He takes advil prn.  ? ? ?  11/26/2021  ? 10:52 AM 03/03/2013  ?  3:55 PM  ?Depression screen PHQ 2/9  ?Decreased Interest 1 3  ?Down, Depressed, Hopeless 1 1  ?PHQ - 2 Score 2 4  ?Altered sleeping 0 0  ?Tired, decreased energy 0 1  ?Change in appetite 0 2  ?Feeling bad or failure about yourself  2 0  ?Trouble concentrating 0 1  ?Moving slowly or fidgety/restless 0 0  ?Suicidal thoughts 0 0  ?PHQ-9 Score 4 8  ?Difficult doing work/chores Somewhat difficult   ? ? ?  11/26/2021  ? 10:53 AM  ?GAD 7 : Generalized Anxiety Score  ?Nervous, Anxious, on Edge 0  ?Control/stop worrying 0  ?Worry too much - different things 1  ?Trouble relaxing 0  ?Restless 0  ?Easily annoyed or irritable 0  ?Afraid - awful might happen 0  ?Total GAD 7 Score 1  ?Anxiety Difficulty Somewhat difficult  ? ? ? ? ?Outpatient Encounter Medications as of 11/26/2021  ?Medication Sig  ? ibuprofen (ADVIL) 800 MG tablet Take 1  tablet (800 mg total) by mouth every 6 (six) hours as needed for moderate pain.  ? naproxen (NAPROSYN) 500 MG tablet Take 1 tablet (500 mg total) by mouth 2 (two) times daily with a meal.  ? [DISCONTINUED] albuterol (PROVENTIL) (2.5 MG/3ML) 0.083% nebulizer solution Take 2.5 mg by nebulization as needed for wheezing.   ? [DISCONTINUED] albuterol (VENTOLIN HFA) 108 (90 Base) MCG/ACT inhaler Inhale 2 puffs into the lungs every 4 (four) hours as needed for shortness of breath. For shortness of breath  ? [DISCONTINUED] benzonatate (TESSALON) 100 MG capsule Take 1-2 capsules (100-200 mg total) by mouth 3 (three) times daily as needed for cough.  ? [DISCONTINUED] cetirizine (ZYRTEC) 10 MG tablet Take 1 tablet (10 mg total) by mouth daily.  ? [DISCONTINUED] guanFACINE (INTUNIV) 4 MG TB24 Take 1 tablet (4 mg total) by mouth daily. (Patient not taking: Reported on 12/03/2019)  ? [DISCONTINUED] HYDROcodone-acetaminophen (NORCO/VICODIN) 5-325 MG per tablet Take 1-2 tablets by mouth every 6 hours as needed for pain and/or cough. (Patient not taking: Reported on 12/03/2019)  ? [DISCONTINUED] methocarbamol (ROBAXIN) 500 MG tablet Take 1 tablet (500 mg total) by mouth every 8 (eight) hours as needed for muscle spasms.  ? [DISCONTINUED] methylphenidate (CONCERTA) 54  MG CR tablet Take 1 tablet (54 mg total) by mouth every morning. (Patient not taking: Reported on 12/03/2019)  ? [DISCONTINUED] predniSONE (DELTASONE) 20 MG tablet Take 2 tablets daily with breakfast.  ? [DISCONTINUED] promethazine-dextromethorphan (PROMETHAZINE-DM) 6.25-15 MG/5ML syrup Take 5 mLs by mouth at bedtime as needed for cough.  ? ?No facility-administered encounter medications on file as of 11/26/2021.  ? ? ?Past Medical History:  ?Diagnosis Date  ? ADHD (attention deficit hyperactivity disorder)   ? Allergy   ? Allergy   ? Asthma   ? Bipolar 1 disorder (HCC)   ? ? ?Past Surgical History:  ?Procedure Laterality Date  ? HAND SURGERY Right 2003  ? ? ?Family History   ?Problem Relation Age of Onset  ? Depression Mother   ? Asthma Mother   ? Arthritis Mother   ? Anxiety disorder Mother   ? Fibromyalgia Mother   ? Alcohol abuse Father   ? Hypertension Father   ? ? ?Social History  ? ?Socioeconomic History  ? Marital status: Single  ?  Spouse name: Not on file  ? Number of children: 1  ? Years of education: 79  ? Highest education level: High school graduate  ?Occupational History  ? Not on file  ?Tobacco Use  ? Smoking status: Former  ?  Packs/day: 1.00  ?  Years: 6.00  ?  Pack years: 6.00  ?  Types: Cigarettes  ?  Quit date: 2018  ?  Years since quitting: 5.2  ? Smokeless tobacco: Never  ?Vaping Use  ? Vaping Use: Never used  ?Substance and Sexual Activity  ? Alcohol use: Yes  ?  Alcohol/week: 28.0 standard drinks  ?  Types: 28 Cans of beer per week  ?  Comment: occ  ? Drug use: No  ? Sexual activity: Yes  ?Other Topics Concern  ? Not on file  ?Social History Narrative  ? Not on file  ? ?Social Determinants of Health  ? ?Financial Resource Strain: Not on file  ?Food Insecurity: Not on file  ?Transportation Needs: Not on file  ?Physical Activity: Not on file  ?Stress: Not on file  ?Social Connections: Not on file  ?Intimate Partner Violence: Not on file  ? ? ?ROS ?Negative unless specially indicated above in HPI. ?  ? ? ?Objective   ? ?BP 123/78   Pulse 61   Temp 98.5 ?F (36.9 ?C) (Temporal)   Ht 5\' 11"  (1.803 m)   Wt 189 lb 6 oz (85.9 kg)   BMI 26.41 kg/m?  ? ?Physical Exam ?Vitals and nursing note reviewed.  ?Constitutional:   ?   General: He is not in acute distress. ?   Appearance: He is not ill-appearing, toxic-appearing or diaphoretic.  ?HENT:  ?   Head: Normocephalic and atraumatic.  ?   Nose: Nose normal.  ?Eyes:  ?   Extraocular Movements: Extraocular movements intact.  ?   Conjunctiva/sclera: Conjunctivae normal.  ?   Pupils: Pupils are equal, round, and reactive to light.  ?Cardiovascular:  ?   Rate and Rhythm: Normal rate and regular rhythm.  ?   Heart sounds:  Normal heart sounds. No murmur heard. ?Pulmonary:  ?   Effort: Pulmonary effort is normal. No respiratory distress.  ?   Breath sounds: Normal breath sounds.  ?Abdominal:  ?   General: Bowel sounds are normal. There is no distension.  ?   Palpations: Abdomen is soft.  ?   Tenderness: There is no abdominal tenderness. There is  no guarding or rebound.  ?Musculoskeletal:  ?   Right knee: No swelling, deformity, effusion, erythema, ecchymosis or bony tenderness. Normal range of motion. No tenderness.  ?   Left knee: No swelling, deformity, effusion, erythema, ecchymosis or bony tenderness. Normal range of motion. No tenderness. Normal patellar mobility.  ?   Instability Tests: Medial McMurray test negative and lateral McMurray test negative.  ?   Right lower leg: No edema.  ?   Left lower leg: No edema.  ?Skin: ?   General: Skin is warm and dry.  ?Neurological:  ?   General: No focal deficit present.  ?   Mental Status: He is alert and oriented to person, place, and time.  ?   Motor: No weakness.  ?   Gait: Gait normal.  ?Psychiatric:     ?   Mood and Affect: Mood normal.     ?   Behavior: Behavior normal.  ? ? ? ?  ? ?Assessment & Plan:  ? ?Maher was seen today for new patient (initial visit). ? ?Diagnoses and all orders for this visit: ? ?ADHD (attention deficit hyperactivity disorder), combined type ?Bipolar affective disorder, remission status unspecified (HCC) ?Not currently on treatment and has not been for years. PHQ score of 4, GAD score of 1. Denies SI or mania. Referral to psychiatry for treatment.  ?-     Ambulatory referral to Psychiatry ? ?Loud snoring ?With reported apnea and gasping. Referral for sleep study.  ?-     Ambulatory referral to Sleep Studies ? ?Mild intermittent asthma, unspecified whether complicated ?Well controlled on current regimen. Refill provided.  ?-     albuterol (PROAIR HFA) 108 (90 Base) MCG/ACT inhaler; Inhale 1-2 puffs into the lungs every 6 (six) hours as needed for wheezing or  shortness of breath. ? ?Chronic pain of right knee ?Xray today, report pending. Can continue advil, tylenol, rest, bracing.  ?-     DG Knee 1-2 Views Right; Future ? ?Return in about 6 weeks (around 01/07/2022)

## 2021-11-26 NOTE — Patient Instructions (Signed)
Managing Bipolar Disorder If you have been diagnosed with bipolar disorder, you may be relieved to now know that this is why you have felt or behaved a certain way. You may also feel overwhelmed about the treatment ahead, how to get needed support, and how to deal with your condition each day. With care and support, you can learn to manage your symptoms and live with your condition. How to manage lifestyle changes Managing stress Stress is your body's reaction to life changes and events, both good and bad. Stress can affect your condition, so it is important to learn how to manage stress. Some techniques to help manage stress include: Meditation, muscle relaxation, and breathing exercises. Exercise. Even a short daily walk can help to lower stress levels. Getting enough good-quality sleep. Too little sleep can trigger the emotional highs of mania. Making a schedule to manage your time. A daily schedule can help keep you from feeling overwhelmed by tasks and deadlines. Spending time on hobbies you enjoy.  Medicines Your health care provider may suggest certain medicines if he or she thinks that these will help improve your condition. Avoid using caffeine, alcohol, and other substances that may prevent your medicines from working properly. Be sure to: Talk with your pharmacist or health care provider about all the medicines that you take, their possible side effects, and which medicines are safe to take together. Make it your goal to take part in all treatment decisions with your health care provider (shared decision-making). Ask about possible side effects of medicines, and share how you feel about having those side effects. It is best if shared decision-making is part of your total treatment plan. If you are taking medicines as part of your treatment, do not stop taking them before asking if it is safe to stop. You may need to have the medicine slowly decreased (tapered) over time to lower the risk of  harmful side effects. Relationships Spend time with people whom you trust and with whom you feel a sense of understanding and calm. Try to find friends or family members who make you feel safe and can help you control feelings of mania. Consider going to couples counseling, family education classes, or family therapy to: Teach your loved ones about your condition and suggest ways they can support you. Help resolve conflicts. Help develop communication skills in your relationships.  How to recognize changes in your condition Everyone responds differently to treatment for bipolar disorder. Signs that your condition is improving include: Leveling of your mood. You may have less anger and excitement about daily activities, and your low moods may not be as bad. Your symptoms being less intense. Feeling calm more often. Thinking clearly. Not experiencing consequences for extreme behavior. Feeling like your life is settling down. Your behavior seeming more normal to you and to other people. Signs that your condition may be getting worse include: Sleep problems. Moods cycling between deep lows and unusually high energy. Extreme emotions, or more anger toward loved ones. Staying away from others, or isolating yourself. A feeling of power or superiority. Completing a lot of tasks in a very short amount of time. Unusual thoughts and behaviors. Thoughts of suicide. Follow these instructions at home: Medicines Take over-the-counter and prescription medicines only as told by your health care provider or pharmacist. Ask your pharmacist what over-the-counter cold medicines you should avoid. Some medicines can make symptoms worse. General instructions Ask for support from trusted family members or friends to make sure you stay on   track with your treatment. Keep a journal to write down your daily moods, medicines, sleep habits, and life events. This may bring you more success with your treatment. Make  and follow a routine for daily meal times. Eat healthy foods, such as whole grains, vegetables, and fresh fruit. Try to go to sleep and wake up around the same time every day. Avoid using products that contain nicotine or tobacco. If you want help quitting, ask your health care provider. Keep all follow-up visits. This is important. Where to find support Helplines and other support Substance Abuse and Mental Health Services Administration (SAMHSA) National Helpline: 1-800-662-HELP (4357) National Alliance on Mental Illness (NAMI) HelpLine: 1-800-950-NAMI (6264) or email helpline@nami.org Depression and Bipolar Support Alliance: dbsalliance.org Talking to others Try making a list of the people you may want to tell about your condition, such as the people you trust most. Plan what you are willing and not willing to talk about. Think about your needs ahead of time and how others can support you. Let your loved ones know when they can share advice and when you would just like them to listen. Give your loved ones information about bipolar disorder and encourage them to learn about the condition. Finances Not all insurance plans provide the same coverage for mental health care, so it is important to check with your insurance carrier. If paying for co-pays or counseling services is a problem, search for a local or county mental health care center. Public mental health care services may be offered there at a low cost or no cost when you are not able to see a private health care provider. If you are taking medicine for depression, you may be able to get the generic form, which may cost less than brand-name medicine. Some makers of prescription medicines also offer help to people who cannot afford the medicines they need. Questions to ask your health care provider: If you are taking medicines How long do I need to take medicine? Are there any long-term side effects of my medicine? Are there any  alternatives to taking medicine? Other questions How would I benefit from therapy? How often should I follow up with a health care provider? Contact a health care provider if: Your symptoms get worse or they do not get better with treatment. Get help right away if: You have thoughts about harming yourself or others. If you ever feel like you may hurt yourself or others, or have thoughts about taking your own life, get help right away. Go to your nearest emergency department or: Call your local emergency services (911 in the [place] Call a suicide crisis helpline, such as the National Suicide Prevention Lifeline at 1-800-273-8255 or 988 in the [place] This is open 24 hours a day in the [place] Text the Crisis Text Line at 741741 (in the [place] Summary Stress can affect your condition, so it is important to learn how to manage stress. Aim to take part in all treatment decisions (shared decision-making). Teach your loved ones about your condition and suggest ways they can support you. Contact a health care provider if your symptoms get worse or they do not get better with treatment. This information is not intended to replace advice given to you by your health care provider. Make sure you discuss any questions you have with your health care provider. Document Revised: 02/20/2021 Document Reviewed: 01/16/2021 Elsevier Patient Education  2023 Elsevier Inc.  

## 2021-11-28 ENCOUNTER — Telehealth: Payer: Self-pay | Admitting: Family Medicine

## 2021-12-05 NOTE — Telephone Encounter (Signed)
Do I need to place another referral for patient? He did not have a preference for location.  ?

## 2022-01-07 ENCOUNTER — Ambulatory Visit (INDEPENDENT_AMBULATORY_CARE_PROVIDER_SITE_OTHER): Payer: Medicaid Other | Admitting: Family Medicine

## 2022-01-07 ENCOUNTER — Encounter: Payer: Self-pay | Admitting: Family Medicine

## 2022-01-07 VITALS — BP 119/73 | HR 58 | Temp 98.0°F | Ht 71.0 in | Wt 192.4 lb

## 2022-01-07 DIAGNOSIS — Z0001 Encounter for general adult medical examination with abnormal findings: Secondary | ICD-10-CM | POA: Diagnosis not present

## 2022-01-07 DIAGNOSIS — M546 Pain in thoracic spine: Secondary | ICD-10-CM

## 2022-01-07 DIAGNOSIS — Z0000 Encounter for general adult medical examination without abnormal findings: Secondary | ICD-10-CM

## 2022-01-07 DIAGNOSIS — R0683 Snoring: Secondary | ICD-10-CM | POA: Diagnosis not present

## 2022-01-07 MED ORDER — NAPROXEN 250 MG PO TABS: 500.0000 mg | ORAL_TABLET | Freq: Two times a day (BID) | ORAL | 0 refills | Status: DC

## 2022-01-07 NOTE — Progress Notes (Signed)
Lee Ramos is a 26 y.o. male presents to office today for annual physical exam examination.    Concerns today include: 1. Reports pulled muscle in his left mid back. Started yesterday when working on a truck. Reports constant ache with sharp pain with movements. Denies numbness, tingling, saddle anesthesia, changes in gait. He is currently taking prednisone for a sinus infection but otherwise has not tried any remedies.   Occupation: Manufacturing systems engineer, Marital status: single, Substance use: former smoker Diet: regular, Exercise: active at work  He would like another referral to sleep studies. Report's that Dr. Lona Millard office did not accept his insurance.   Past Medical History:  Diagnosis Date   ADHD (attention deficit hyperactivity disorder)    Allergy    Allergy    Asthma    Bipolar 1 disorder (HCC)    Social History   Socioeconomic History   Marital status: Single    Spouse name: Not on file   Number of children: 1   Years of education: 12   Highest education level: High school graduate  Occupational History   Not on file  Tobacco Use   Smoking status: Former    Packs/day: 1.00    Years: 6.00    Pack years: 6.00    Types: Cigarettes    Quit date: 2018    Years since quitting: 5.4   Smokeless tobacco: Never  Vaping Use   Vaping Use: Never used  Substance and Sexual Activity   Alcohol use: Yes    Alcohol/week: 28.0 standard drinks    Types: 28 Cans of beer per week    Comment: occ   Drug use: No   Sexual activity: Yes  Other Topics Concern   Not on file  Social History Narrative   Not on file   Social Determinants of Health   Financial Resource Strain: Not on file  Food Insecurity: Not on file  Transportation Needs: Not on file  Physical Activity: Not on file  Stress: Not on file  Social Connections: Not on file  Intimate Partner Violence: Not on file   Past Surgical History:  Procedure Laterality Date   HAND SURGERY Right 2003   Family History   Problem Relation Age of Onset   Depression Mother    Asthma Mother    Arthritis Mother    Anxiety disorder Mother    Fibromyalgia Mother    Alcohol abuse Father    Hypertension Father     Current Outpatient Medications:    albuterol (PROAIR HFA) 108 (90 Base) MCG/ACT inhaler, Inhale 1-2 puffs into the lungs every 6 (six) hours as needed for wheezing or shortness of breath., Disp: 18 g, Rfl: 3   predniSONE (DELTASONE) 20 MG tablet, Take 40 mg by mouth daily., Disp: , Rfl:   Allergies  Allergen Reactions   Pepto-Bismol  [Bismuth] Hives   Nickel Rash     ROS: Review of Systems Pertinent items noted in HPI and remainder of comprehensive ROS otherwise negative.    Physical exam BP 119/73   Pulse (!) 58   Temp 98 F (36.7 C) (Temporal)   Ht _0  (1.803 m)   Wt 192 lb 6 oz (87.3 kg)   SpO2 98%   BMI 26.83 kg/m  General appearance: alert, cooperative, and no distress Head: Normocephalic, without obvious abnormality, atraumatic Eyes: conjunctivae/corneas clear. PERRL, EOM's intact. Fundi benign. Ears: normal TM's and external ear canals both ears Nose: Nares normal. Septum midline. Mucosa normal. No drainage or sinus  tenderness. Throat: lips, mucosa, and tongue normal; teeth and gums normal Neck: no adenopathy, no carotid bruit, no JVD, supple, symmetrical, trachea midline, and thyroid not enlarged, symmetric, no tenderness/mass/nodules Back: symmetric, no curvature. ROM normal. No CVA tenderness. Lungs: clear to auscultation bilaterally Chest wall: no tenderness Heart: regular rate and rhythm, S1, S2 normal, no murmur, click, rub or gallop Abdomen: soft, non-tender; bowel sounds normal; no masses,  no organomegaly Extremities: extremities normal, atraumatic, no cyanosis or edema Skin: Skin color, texture, turgor normal. No rashes or lesions Lymph nodes: Cervical, supraclavicular nodes normal. Neurologic: Alert and oriented X 3, normal strength and tone. Normal symmetric  reflexes. Normal coordination and gait    Assessment/ Plan: Lee Ramos here for annual physical exam.   [name] was seen today for annual exam.  Diagnoses and all orders for this visit:  Routine general medical examination at a health care facility Fasting labs pending.  -     CBC with Differential/Platelet -     CMP14+EGFR -     Lipid panel -     Thyroid Panel With TSH  Acute left-sided thoracic back pain Discussed rest, ice, heat, naproxen as below.  -     naproxen (NAPROSYN) 250 MG tablet; Take 2 tablets (500 mg total) by mouth 2 (two) times daily with a meal.  Loud snoring New referral placed.  -     Ambulatory referral to Sleep Studies   Counseled on healthy lifestyle choices, including diet (rich in fruits, vegetables and lean meats and low in salt and simple carbohydrates) and exercise (at least 30 minutes of moderate physical activity daily).  Patient to follow up in 1 year for annual exam or sooner if needed.  The above assessment and management plan was discussed with the patient. The patient verbalized understanding of and has agreed to the management plan. Patient is aware to call the clinic if symptoms persist or worsen. Patient is aware when to return to the clinic for a follow-up visit. Patient educated on when it is appropriate to go to the emergency department.   Marjorie Smolder, FNP-C [facility] [address] [place], [place] [address] [phone]

## 2022-01-07 NOTE — Patient Instructions (Addendum)
[facility] [address] [place] [phone]  Health Maintenance, Male Adopting a healthy lifestyle and getting preventive care are important in promoting health and wellness. Ask your health care provider about: The right schedule for you to have regular tests and exams. Things you can do on your own to prevent diseases and keep yourself healthy. What should I know about diet, weight, and exercise? Eat a healthy diet  Eat a diet that includes plenty of vegetables, fruits, low-fat dairy products, and lean protein. Do not eat a lot of foods that are high in solid fats, added sugars, or sodium. Maintain a healthy weight Body mass index (BMI) is a measurement that can be used to identify possible weight problems. It estimates body fat based on height and weight. Your health care provider can help determine your BMI and help you achieve or maintain a healthy weight. Get regular exercise Get regular exercise. This is one of the most important things you can do for your health. Most adults should: Exercise for at least 150 minutes each week. The exercise should increase your heart rate and make you sweat (moderate-intensity exercise). Do strengthening exercises at least twice a week. This is in addition to the moderate-intensity exercise. Spend less time sitting. Even light physical activity can be beneficial. Watch cholesterol and blood lipids Have your blood tested for lipids and cholesterol at 26 years of age, then have this test every 5 years. You may need to have your cholesterol levels checked more often if: Your lipid or cholesterol levels are high. You are older than 26 years of age. You are at high risk for heart disease. What should I know about cancer screening? Many types of cancers can be detected early and may often be prevented. Depending on your health history and family history, you may need to have cancer screening at various ages.  This may include screening for: Colorectal cancer. Prostate cancer. Skin cancer. Lung cancer. What should I know about heart disease, diabetes, and high blood pressure? Blood pressure and heart disease High blood pressure causes heart disease and increases the risk of stroke. This is more likely to develop in people who have high blood pressure readings or are overweight. Talk with your health care provider about your target blood pressure readings. Have your blood pressure checked: Every 3-5 years if you are 58-2 years of age. Every year if you are 6 years old or older. If you are between the ages of 60 and 58 and are a current or former smoker, ask your health care provider if you should have a one-time screening for abdominal aortic aneurysm (AAA). Diabetes Have regular diabetes screenings. This checks your fasting blood sugar level. Have the screening done: Once every three years after age 10 if you are at a normal weight and have a low risk for diabetes. More often and at a younger age if you are overweight or have a high risk for diabetes. What should I know about preventing infection? Hepatitis B If you have a higher risk for hepatitis B, you should be screened for this virus. Talk with your health care provider to find out if you are at risk for hepatitis B infection. Hepatitis C Blood testing is recommended for: Everyone born from [birth year] through 1965. Anyone with known risk factors for hepatitis C. Sexually transmitted infections (STIs) You should be screened each year for STIs, including gonorrhea and chlamydia, if: You are sexually active and are younger than  26 years of age. You are older than 26 years of age and your health care provider tells you that you are at risk for this type of infection. Your sexual activity has changed since you were last screened, and you are at increased risk for chlamydia or gonorrhea. Ask your health care provider if you are at risk. Ask your  health care provider about whether you are at high risk for HIV. Your health care provider may recommend a prescription medicine to help prevent HIV infection. If you choose to take medicine to prevent HIV, you should first get tested for HIV. You should then be tested every 3 months for as long as you are taking the medicine. Follow these instructions at home: Alcohol use Do not drink alcohol if your health care provider tells you not to drink. If you drink alcohol: Limit how much you have to 0-2 drinks a day. Know how much alcohol is in your drink. In the [place] one drink equals one 12 oz bottle of beer (355 mL), one 5 oz glass of wine (148 mL), or one 1 oz glass of hard liquor (44 mL). Lifestyle Do not use any products that contain nicotine or tobacco. These products include cigarettes, chewing tobacco, and vaping devices, such as e-cigarettes. If you need help quitting, ask your health care provider. Do not use street drugs. Do not share needles. Ask your health care provider for help if you need support or information about quitting drugs. General instructions Schedule regular health, dental, and eye exams. Stay current with your vaccines. Tell your health care provider if: You often feel depressed. You have ever been abused or do not feel safe at home. Summary Adopting a healthy lifestyle and getting preventive care are important in promoting health and wellness. Follow your health care provider's instructions about healthy diet, exercising, and getting tested or screened for diseases. Follow your health care provider's instructions on monitoring your cholesterol and blood pressure. This information is not intended to replace advice given to you by your health care provider. Make sure you discuss any questions you have with your health care provider. Document Revised: 12/16/2020 Document Reviewed: 12/16/2020 Elsevier Patient Education  [employer].

## 2022-01-08 LAB — CMP14+EGFR
ALT: 14 IU/L (ref 0–44)
AST: 13 IU/L (ref 0–40)
Albumin/Globulin Ratio: 1.3 (ref 1.2–2.2)
Albumin: 4.4 g/dL (ref 4.1–5.2)
Alkaline Phosphatase: 63 IU/L (ref 44–121)
BUN/Creatinine Ratio: 13 (ref 9–20)
BUN: 14 mg/dL (ref 6–20)
Bilirubin Total: 0.2 mg/dL (ref 0.0–1.2)
CO2: 24 mmol/L (ref 20–29)
Calcium: 9.7 mg/dL (ref 8.7–10.2)
Chloride: 103 mmol/L (ref 96–106)
Creatinine, Ser: 1.06 mg/dL (ref 0.76–1.27)
Globulin, Total: 3.3 g/dL (ref 1.5–4.5)
Glucose: 83 mg/dL (ref 70–99)
Potassium: 4.8 mmol/L (ref 3.5–5.2)
Sodium: 140 mmol/L (ref 134–144)
Total Protein: 7.7 g/dL (ref 6.0–8.5)
eGFR: 100 mL/min/{1.73_m2} (ref >59)

## 2022-01-08 LAB — CBC WITH DIFFERENTIAL/PLATELET
Basophils Absolute: 0 10*3/uL (ref 0.0–0.2)
Basos: 1 %
EOS (ABSOLUTE): 0.3 10*3/uL (ref 0.0–0.4)
Eos: 4 %
Hematocrit: 45.8 % (ref 37.5–51.0)
Hemoglobin: 15.3 g/dL (ref 13.0–17.7)
Immature Grans (Abs): 0.1 10*3/uL (ref 0.0–0.1)
Immature Granulocytes: 1 %
Lymphocytes Absolute: 2.1 10*3/uL (ref 0.7–3.1)
Lymphs: 33 %
MCH: 29 pg (ref 26.6–33.0)
MCHC: 33.4 g/dL (ref 31.5–35.7)
MCV: 87 fL (ref 79–97)
Monocytes Absolute: 0.6 10*3/uL (ref 0.1–0.9)
Monocytes: 9 %
Neutrophils Absolute: 3.3 10*3/uL (ref 1.4–7.0)
Neutrophils: 52 %
Platelets: 310 10*3/uL (ref 150–450)
RBC: 5.28 x10E6/uL (ref 4.14–5.80)
RDW: 12.2 % (ref 11.6–15.4)
WBC: 6.4 10*3/uL (ref 3.4–10.8)

## 2022-01-08 LAB — LIPID PANEL
Chol/HDL Ratio: 3.7 ratio (ref 0.0–5.0)
Cholesterol, Total: 150 mg/dL (ref 100–199)
HDL: 41 mg/dL (ref >39)
LDL Chol Calc (NIH): 92 mg/dL (ref 0–99)
Triglycerides: 89 mg/dL (ref 0–149)
VLDL Cholesterol Cal: 17 mg/dL (ref 5–40)

## 2022-01-08 LAB — THYROID PANEL WITH TSH
Free Thyroxine Index: 2.9 (ref 1.2–4.9)
T3 Uptake Ratio: 32 % (ref 24–39)
T4, Total: 9 ug/dL (ref 4.5–12.0)
TSH: 2.02 u[IU]/mL (ref 0.450–4.500)

## 2022-02-15 ENCOUNTER — Ambulatory Visit (INDEPENDENT_AMBULATORY_CARE_PROVIDER_SITE_OTHER): Payer: Medicaid Other

## 2022-02-15 ENCOUNTER — Ambulatory Visit
Admission: EM | Admit: 2022-02-15 | Discharge: 2022-02-15 | Disposition: A | Discharge status: Home / Self Care | Payer: Medicaid Other | Attending: Nurse Practitioner | Admitting: Nurse Practitioner

## 2022-02-15 DIAGNOSIS — M25572 Pain in left ankle and joints of left foot: Secondary | ICD-10-CM | POA: Diagnosis not present

## 2022-02-15 DIAGNOSIS — S93402A Sprain of unspecified ligament of left ankle, initial encounter: Secondary | ICD-10-CM | POA: Diagnosis not present

## 2022-02-15 MED ORDER — IBUPROFEN 800 MG PO TABS
800.0000 mg | ORAL_TABLET | Freq: Three times a day (TID) | ORAL | 0 refills | Status: DC | PRN
Start: 2022-02-15 — End: 2023-03-25

## 2022-02-15 NOTE — ED Triage Notes (Signed)
Pt went line dancing last night,stated he was wearing cowboy boots. Reports left ankle pain and swelling. No known falls or injuries to report. Pt reports he can not put pressure on his ankle.

## 2022-02-15 NOTE — ED Provider Notes (Signed)
[facility]    CSN: [id number] Arrival date & time: 02/15/22  1038      History   Chief Complaint Chief Complaint  Patient presents with   Ankle Pain    HPI Lee Ramos is a 26 y.o. male.   The history is provided by the patient.   Presents for complaints of left ankle pain and swelling x1 day.  Patient states he went line dancing last evening and was wearing cowboy boots.  He states during one of the line dances, he stopped and felt "something" in the left ankle.  He states when he got home, he had swelling in the left ankle.  Today, he is unable to bear weight or put pressure on the ankle.  He also complains of numbness and tingling in the left ankle and foot.  Denies previous injury or trauma to the left ankle.  He has not taken any medication for his symptoms.  Past Medical History:  Diagnosis Date   ADHD (attention deficit hyperactivity disorder)    Allergy    Allergy    Asthma    Bipolar 1 disorder [facility])     Patient Active Problem List   Diagnosis Date Noted   Mild intermittent asthma 11/26/2021   Chronic pain of right knee 11/26/2021   Attention deficit disorder 11/01/2012   Bipolar disorder (HCC) 11/01/2012    Past Surgical History:  Procedure Laterality Date   HAND SURGERY Right 2003       Home Medications    Prior to Admission medications   Medication Sig Start Date End Date Taking? Authorizing Provider  ibuprofen (ADVIL) 800 MG tablet Take 1 tablet (800 mg total) by mouth every 8 (eight) hours as needed. 02/15/22  Yes Kaislee Chao-Warren, Sadie Haber, NP  albuterol (PROAIR HFA) 108 (90 Base) MCG/ACT inhaler Inhale 1-2 puffs into the lungs every 6 (six) hours as needed for wheezing or shortness of breath. 11/26/21   Gabriel Earing, FNP  naproxen (NAPROSYN) 250 MG tablet Take 2 tablets (500 mg total) by mouth 2 (two) times daily with a meal. 01/07/22   Gabriel Earing, FNP  predniSONE (DELTASONE) 20 MG tablet Take 40 mg by mouth daily. 01/03/22    [provider]    Family History Family History  Problem Relation Age of Onset   Depression Mother    Asthma Mother    Arthritis Mother    Anxiety disorder Mother    Fibromyalgia Mother    Alcohol abuse Father    Hypertension Father     Social History Social History   Tobacco Use   Smoking status: Former    Packs/day: 1.00    Years: 6.00    Total pack years: 6.00    Types: Cigarettes    Quit date: 2018    Years since quitting: 5.5   Smokeless tobacco: Never  Vaping Use   Vaping Use: Never used  Substance Use Topics   Alcohol use: Yes    Alcohol/week: 28.0 standard drinks of alcohol    Types: 28 Cans of beer per week    Comment: occ   Drug use: No     Allergies   Pepto-bismol  [bismuth] and Nickel   Review of Systems Review of Systems Per HPI  Physical Exam Triage Vital Signs ED Triage Vitals [02/15/22 1048]  Enc Vitals Group     BP 121/82     Pulse Rate 78     Resp 16     Temp 98.7  F (37.1 C)     Temp Source Oral     SpO2 98 %     Weight      Height      Head Circumference      Peak Flow      Pain Score      Pain Loc      Pain Edu?      Excl. in GC?    No data found.  Updated Vital Signs BP 121/82 (BP Location: Left Arm)   Pulse 78   Temp 98.7 F (37.1 C) (Oral)   Resp 16   SpO2 98%   Visual Acuity Right Eye Distance:   Left Eye Distance:   Bilateral Distance:    Right Eye Near:   Left Eye Near:    Bilateral Near:     Physical Exam Vitals and nursing note reviewed.  Constitutional:      Appearance: Normal appearance.  HENT:     Head: Normocephalic.  Eyes:     Extraocular Movements: Extraocular movements intact.     Pupils: Pupils are equal, round, and reactive to light.  Musculoskeletal:     Cervical back: Normal range of motion.     Left ankle: Swelling present. Tenderness present over the lateral malleolus. Decreased range of motion.     Left foot: Normal range of motion. Swelling present. No deformity or  tenderness.  Skin:    General: Skin is warm and dry.     Capillary Refill: Capillary refill takes less than 2 seconds.  Neurological:     General: No focal deficit present.     Mental Status: He is alert and oriented to person, place, and time.  Psychiatric:        Mood and Affect: Mood normal.        Behavior: Behavior normal.      UC Treatments / Results  Labs (all labs ordered are listed, but only abnormal results are displayed) Labs Reviewed - No data to display  EKG   Radiology DG Ankle Complete Left  Result Date: 02/15/2022 CLINICAL DATA:  Left ankle pain and swelling after dancing 1 day ago. EXAM: LEFT ANKLE COMPLETE - 3+ VIEW COMPARISON:  None Available. FINDINGS: There is diffuse soft tissue swelling identified. There is no sign of acute fracture or dislocation. The no significant arthropathy. IMPRESSION: 1. Soft tissue swelling. 2. No acute bone abnormality. Electronically Signed   By: Signa Kell M.D.   On: 02/15/2022 11:23    Procedures Procedures (including critical care time)  Medications Ordered in UC Medications - No data to display  Initial Impression / Assessment and Plan / UC Course  I have reviewed the triage vital signs and the nursing notes.  Pertinent labs & imaging results that were available during my care of the patient were reviewed by me and considered in my medical decision making (see chart for details).  Patient presents for complaints of left ankle pain and swelling that occurred after line dancing last evening.  On exam, he has tenderness to the lateral malleolus with decreased range of motion.  Patient is unable to bear weight at this time.  X-rays are negative for fracture.  Ace wrap was provided to the patient to provide compression and support.  RICE therapy was also provided.  Encouraged the use of ice to help with pain and swelling.  Patient was prescribed ibuprofen for his pain.  Final Clinical Impressions(s) / UC Diagnoses   Final  diagnoses:  Sprain of  left ankle, unspecified ligament, initial encounter     Discharge Instructions      Your x-rays are negative for fracture or dislocation.   May take over-the-counter ibuprofen or Tylenol for pain or discomfort. RICE therapy rest, ice, compression, and elevation until your symptoms improve.  Apply ice for 20 minutes, remove for 1 hour, then repeat as often as possible.  This will help with pain and swelling. Wear the Ace wrap with strenuous or prolonged activity. If symptoms do not improve within the next 2 to 4 weeks, recommend following up with orthopedics.  You can follow-up with [facility] at [phone] or [facility] in [place] at [phone].      ED Prescriptions     Medication Sig Dispense Auth. Provider   ibuprofen (ADVIL) 800 MG tablet Take 1 tablet (800 mg total) by mouth every 8 (eight) hours as needed. 30 tablet [place]i Ahart-Warren, Sadie Haber, NP      PDMP not reviewed this encounter.   Abran Cantor, NP 02/15/22 1205

## 2022-02-15 NOTE — Discharge Instructions (Addendum)
Your x-rays are negative for fracture or dislocation.   May take over-the-counter ibuprofen or Tylenol for pain or discomfort. RICE therapy rest, ice, compression, and elevation until your symptoms improve.  Apply ice for 20 minutes, remove for 1 hour, then repeat as often as possible.  This will help with pain and swelling. Wear the Ace wrap with strenuous or prolonged activity. If symptoms do not improve within the next 2 to 4 weeks, recommend following up with orthopedics.  You can follow-up with [facility] at [phone] or [facility] in [place] at [phone].

## 2022-02-24 ENCOUNTER — Ambulatory Visit (INDEPENDENT_AMBULATORY_CARE_PROVIDER_SITE_OTHER): Payer: Medicaid Other | Admitting: Neurology

## 2022-02-24 ENCOUNTER — Encounter: Payer: Self-pay | Admitting: Neurology

## 2022-02-24 VITALS — BP 104/68 | HR 61 | Ht 71.0 in | Wt 182.8 lb

## 2022-02-24 DIAGNOSIS — E663 Overweight: Secondary | ICD-10-CM

## 2022-02-24 DIAGNOSIS — G4730 Sleep apnea, unspecified: Secondary | ICD-10-CM | POA: Diagnosis not present

## 2022-02-24 DIAGNOSIS — G4719 Other hypersomnia: Secondary | ICD-10-CM

## 2022-02-24 DIAGNOSIS — R0683 Snoring: Secondary | ICD-10-CM | POA: Diagnosis not present

## 2022-02-24 DIAGNOSIS — R0681 Apnea, not elsewhere classified: Secondary | ICD-10-CM

## 2022-02-24 NOTE — Progress Notes (Signed)
Subjective:    Patient ID: Lee Ramos is a 26 y.o. male.  HPI    Huston Foley, MD, PhD [facility] [address], [address] [address] [place], [place] [address]  Dear Elmarie Shiley,   I saw your patient, Lee Ramos, upon your kind request in my sleep clinic today for initial consultation of his sleep disorder, in particular, concern for underlying obstructive sleep apnea.  The patient is unaccompanied today.  As you know, gentleman with an underlying of allergies, asthma, ADHD, mood disorder and overweight state, who reports snoring and excessive daytime somnolence this morning as witnessed apneas per family and friends.  He has occasionally woken up with a sense of gasping for air.  His sleepiness has become worse over the past year.  I reviewed your office note from 01/07/2022.  His Epworth sleepiness score is 19 out of 24, fatigue severity score is 46 out of 63.  He denies a family history of sleep apnea.  He is single, lives with roommates and has his 44-year-old son on the weekends.  He works in Aeronautical engineer.  He goes to bed between 9 and 10 and rise time is between 5 and 5:30 AM.  He has difficulty maintaining sleep and often wakes up in the early morning hours with difficulty going back to sleep.  He denies night nocturia or recurrent morning headaches.  He does not drink caffeine daily, he does not drink alcohol, he quit smoking in 2018.  His weight has been fluctuating a little bit, his highest weight was 195, current weight 182 and he wants to lose weight. He has joined the gym. He does not wake up rested.  He has become sleepy at the wheel while driving especially when driving after 8 PM, he avoids long distance and nighttime driving.  He has a TV in his bedroom and it tends to be on at night, he tries to turn it off when he feels that he is drifting off to sleep.  He does play on his cell phone before falling asleep.  Past Medical History:  Diagnosis Date   ADHD  (attention deficit hyperactivity disorder)    Allergy    Allergy    Asthma    Bipolar 1 disorder (HCC)     His Past Surgical History Is Significant For: Past Surgical History:  Procedure Laterality Date   HAND SURGERY Right 2003    His Family History Is Significant For: Family History  Problem Relation Age of Onset   Depression Mother    Asthma Mother    Arthritis Mother    Anxiety disorder Mother    Fibromyalgia Mother    Alcohol abuse Father    Hypertension Father    Sleep apnea Neg Hx     His Social History Is Significant For: Social History   Socioeconomic History   Marital status: Single    Spouse name: Not on file   Number of children: 1   Years of education: 12   Highest education level: High school graduate  Occupational History   Not on file  Tobacco Use   Smoking status: Former    Packs/day: 1.00    Years: 6.00    Total pack years: 6.00    Types: Cigarettes    Quit date: 2018    Years since quitting: 5.5   Smokeless tobacco: Never  Vaping Use   Vaping Use: Never used  Substance and Sexual Activity   Alcohol use: Yes    Alcohol/week:  10.0 standard drinks of alcohol    Types: 10 Shots of liquor per week   Drug use: No   Sexual activity: Yes  Other Topics Concern   Not on file  Social History Narrative   Not on file   Social Determinants of Health   Financial Resource Strain: Not on file  Food Insecurity: Not on file  Transportation Needs: Not on file  Physical Activity: Not on file  Stress: Not on file  Social Connections: Not on file    His Allergies Are:  Allergies  Allergen Reactions   Pepto-Bismol  [Bismuth] Hives   Nickel Rash  :   His Current Medications Are:  Outpatient Encounter Medications as of 02/24/2022  Medication Sig   albuterol (PROAIR HFA) 108 (90 Base) MCG/ACT inhaler Inhale 1-2 puffs into the lungs every 6 (six) hours as needed for wheezing or shortness of breath.   ibuprofen (ADVIL) 800 MG tablet Take 1 tablet  (800 mg total) by mouth every 8 (eight) hours as needed.   naproxen (NAPROSYN) 250 MG tablet Take 2 tablets (500 mg total) by mouth 2 (two) times daily with a meal.   predniSONE (DELTASONE) 20 MG tablet Take 40 mg by mouth daily.   No facility-administered encounter medications on file as of 02/24/2022.  :   Review of Systems:  Out of a complete 14 point review of systems, all are reviewed and negative with the exception of these symptoms as listed below:  Review of Systems  Neurological:        Pt here for sleep consults  Pt snores,fatigue,headaches,stop breathing during the night. Pt denies sleep study,CPAP,hypertension    ESS:19 FSS:46    Objective:  Neurological Exam  Physical Exam Physical Examination:   Vitals:   02/24/22 1236  BP: 104/68  Pulse: 61    General Examination: The patient is a very pleasant 26 y.o. male in no acute distress. He appears well-developed and well-nourished and well groomed.   HEENT: Normocephalic, atraumatic, pupils are equal, round and reactive to light, extraocular tracking is good without limitation to gaze excursion or nystagmus noted. Hearing is grossly intact. Face is symmetric with normal facial animation. Speech is clear with no dysarthria noted. There is no hypophonia. There is no lip, neck/head, jaw or voice tremor. Neck is supple with full range of passive and active motion. There are no carotid bruits on auscultation. Oropharynx exam reveals: mild mouth dryness, good dental hygiene and moderate airway crowding secondary to tonsillar size of 1-2+ on the right and 1+ on the left, slightly wider uvula.  Mallampati class III.  Minimal overbite.  Tongue protrudes centrally and palate elevates symmetrically.  Neck circumference of 16-1/2 inches.  Nasal inspection reveals septum deviation to the left, he has a history of nasal fracture twice.   Chest: Clear to auscultation without wheezing, rhonchi or crackles noted.  Heart: S1+S2+0, regular  and normal without murmurs, rubs or gallops noted.   Abdomen: Soft, non-tender and non-distended with normal bowel sounds appreciated on auscultation.  Extremities: There is no obvious edema in the distal lower extremities bilaterally.   Skin: Warm and dry without trophic changes noted.   Musculoskeletal: exam reveals no obvious joint deformities, with the exception of missing fingertips on digit 2 and 3 on the right (lawnmower accident at age 7).   Neurologically:  Mental status: The patient is awake, alert and oriented in all 4 spheres. His immediate and remote memory, attention, language skills and fund of knowledge are appropriate.  There is no evidence of aphasia, agnosia, apraxia or anomia. Speech is clear with normal prosody and enunciation. Thought process is linear. Mood is normal and affect is normal.  Cranial nerves II - XII are as described above under HEENT exam.  Motor exam: Normal bulk, strength and tone is noted. There is no obvious tremor. Fine motor skills and coordination: grossly intact.  Cerebellar testing: No dysmetria or intention tremor. There is no truncal or gait ataxia.  Sensory exam: intact to light touch in the upper and lower extremities.  Gait, station and balance: He stands easily. No veering to one side is noted. No leaning to one side is noted. Posture is age-appropriate and stance is narrow based. Gait shows normal stride length and normal pace. No problems turning are noted.   Assessment and Plan:  In summary, Lee Ramos is a very pleasant 26 y.o.-year old male with an underlying of allergies, asthma, ADHD, mood disorder and overweight state, whose history and physical exam concerning for sleep disordered breathing, supporting a current working diagnosis of unspecified sleep apnea, with the main differential diagnoses of obstructive sleep apnea (OSA) versus upper airway resistance syndrome (UARS) versus central sleep apnea (CSA), or mixed sleep apnea. A  laboratory attended sleep study is considered gold standard for evaluation of sleep disordered breathing and is recommended at this time and clinically justified.   I had a long chat with the patient about my findings and the diagnosis of sleep apnea, particularly OSA, its prognosis and treatment options. We talked about medical/conservative treatments, surgical interventions and non-pharmacological approaches for symptom control. I explained, in particular, the risks and ramifications of untreated moderate to severe OSA, especially with respect to developing cardiovascular disease down the road, including congestive heart failure (CHF), difficult to treat hypertension, cardiac arrhythmias (particularly A-fib), neurovascular complications including TIA, stroke and dementia. Even type 2 diabetes has, in part, been linked to untreated OSA. Symptoms of untreated OSA may include (but may not be limited to) daytime sleepiness, nocturia (i.e. frequent nighttime urination), memory problems, mood irritability and suboptimally controlled or worsening mood disorder such as depression and/or anxiety, lack of energy, lack of motivation, physical discomfort, as well as recurrent headaches, especially morning or nocturnal headaches. We talked about the importance of maintaining a healthy lifestyle and striving for healthy weight. In addition, we talked about the importance of striving for and maintaining good sleep hygiene.  He is strongly advised not to drive when feeling sleepy. I recommended the following at this time: sleep study.  I outlined the differences between a laboratory attended sleep study which is considered more comprehensive and accurate over the option of a home sleep test (HST); the latter may lead to underestimation of sleep disordered breathing in some instances and does not help with diagnosing upper airway resistance syndrome and is not accurate enough to diagnose primary central sleep apnea typically. I  explained the different sleep test procedures to the patient in detail and also outlined possible surgical and non-surgical treatment options of OSA, including the use of a pressure airway pressure (PAP) device (ie CPAP, AutoPAP/APAP or BiPAP in certain circumstances), a custom-made dental device (aka oral appliance, which would require a referral to a specialist dentist or orthodontist typically, and is generally speaking not considered a good choice for patients with full dentures or edentulous state), upper airway surgical options, such as traditional UPPP (which is not considered a first-line treatment) or the Inspire device (hypoglossal nerve stimulator, which would involve a referral  for consultation with an ENT surgeon, after careful selection, following inclusion criteria). I explained the PAP treatment option to the patient in detail, as this is generally considered first-line treatment.  The patient indicated that he would be willing to try PAP therapy, if the need arises. I explained the importance of being compliant with PAP treatment, not only for insurance purposes but primarily to improve patient's symptoms symptoms, and for the patient's long term health benefit, including to reduce His cardiovascular risks longer-term.    We will pick up our discussion about the next steps and treatment options after testing.  We will keep him posted as to the test results by phone call and/or MyChart messaging where possible.  We will plan to follow-up in sleep clinic accordingly as well.  I answered all his questions today and the patient was in agreement.   I encouraged him to call with any interim questions, concerns, problems or updates or email [place] through [place].  Generally speaking, sleep test authorizations may take up to 2 weeks, sometimes less, sometimes longer, the patient is encouraged to get in touch with [place] if they do not hear back from the sleep lab staff directly within the next 2  weeks.  Thank you very much for allowing me to participate in the care of this nice patient. If I can be of any further assistance to you please do not hesitate to call me at [phone].  Sincerely,   Star Age, MD, PhD

## 2022-02-24 NOTE — Patient Instructions (Signed)

## 2022-03-17 ENCOUNTER — Telehealth: Payer: Self-pay

## 2022-03-17 NOTE — Telephone Encounter (Signed)
LVM for pt to call back to schedule sleep study.  

## 2022-03-23 NOTE — Telephone Encounter (Signed)
Patient called  back.  [facility]: [id number] (exp. 03/11/22 to 06/09/22)   He is scheduled at [facility] for 04/14/22 at 3 pm.   Mailed packet to the patient.

## 2022-04-14 ENCOUNTER — Ambulatory Visit (INDEPENDENT_AMBULATORY_CARE_PROVIDER_SITE_OTHER): Payer: Medicaid Other | Admitting: Neurology

## 2022-04-14 DIAGNOSIS — G4719 Other hypersomnia: Secondary | ICD-10-CM

## 2022-04-14 DIAGNOSIS — E663 Overweight: Secondary | ICD-10-CM

## 2022-04-14 DIAGNOSIS — R0683 Snoring: Secondary | ICD-10-CM | POA: Diagnosis not present

## 2022-04-14 DIAGNOSIS — G4730 Sleep apnea, unspecified: Secondary | ICD-10-CM

## 2022-04-14 DIAGNOSIS — R0681 Apnea, not elsewhere classified: Secondary | ICD-10-CM

## 2022-04-21 ENCOUNTER — Telehealth: Payer: Self-pay

## 2022-04-21 NOTE — Telephone Encounter (Signed)
I called patient.  I discussed his sleep study results and recommendations.  Patient would like a follow-up appointment to discuss these results and subsequent treatment options further.  An appointment was made for October 31 at 9:45 AM with Dr. Frances Furbish.  I reminded patient not to drive when feeling sleepy.  Patient verbalized understanding of results and recommendations.  Patient had no further questions or concerns at this time.

## 2022-04-21 NOTE — Procedures (Signed)
   [facility]  HOME SLEEP TEST (Watch PAT) REPORT  STUDY DATE: 04/14/2022  DOB: [birth year]  MRN: [id number]  ORDERING CLINICIAN: Huston Foley, MD, PhD   REFERRING CLINICIAN: Gabriel Earing, FNP   CLINICAL INFORMATION/HISTORY: 26 yo male with a history of allergies, asthma, ADHD, mood disorder and overweight state, who reports snoring and excessive daytime somnolence this morning as witnessed apneas per family and friends.   Epworth sleepiness score: 19/24.  BMI: 25.6 kg/m  FINDINGS:   Sleep Summary:   Total Recording Time (hours, min): 7 hours, 45  min  Total Sleep Time (hours, min):  6 hours, 3 min  Percent REM (%):    25.6%   Respiratory Indices:   Calculated pAHI (per hour):  3.6/hour         REM pAHI:    5.6/hour       NREM pAHI: 3.0/hour  Central pAHI: 0/hour  Oxygen Saturation Statistics:    Oxygen Saturation (%) Mean: 96%   Minimum oxygen saturation (%):                 91%   O2 Saturation Range (%): 91-100%    O2 Saturation (minutes) <=88%: 0 min  Pulse Rate Statistics:   Pulse Mean (bpm):    63/min    Pulse Range (43-106/min)   IMPRESSION: Primary snoring  RECOMMENDATION:  This home sleep test does not demonstrate any significant obstructive or central sleep disordered breathing with an AHI of 3.6/h, O2 nadir 91%.  Snoring ranged in the mild to moderate range, at times louder.  Treatment with a positive airway pressure device such as AutoPap or CPAP is not indicated.  For disturbing snoring, an oral appliance can be considered.  Other causes of the patient's symptoms, including circadian rhythm disturbances, an underlying mood disorder, medication effect and/or an underlying medical problem cannot be ruled out based on this test. Clinical correlation is recommended.  If there is clinical suspicion for narcolepsy or idiopathic hypersomnolence, a laboratory attended sleep study with a next a nap study should be considered.  The  patient should be cautioned not to drive, work at heights, or operate dangerous or heavy equipment when tired or sleepy. Review and reiteration of good sleep hygiene measures should be pursued with any patient. The patient can follow up with his referring provider, who will be notified of the test results. An appointment in sleep clinic can be made as necessary.   I certify that I have reviewed the raw data recording prior to the issuance of this report in accordance with the standards of the American Academy of Sleep Medicine (AASM).  INTERPRETING PHYSICIAN:   Huston Foley, MD, PhD  Board Certified in Neurology and Sleep Medicine  [facility] [address] [place], [place] [address] [phone]

## 2022-04-21 NOTE — Telephone Encounter (Signed)
-----   Message from Huston Foley, MD sent at 04/21/2022  9:32 AM EDT ----- Patient referred by PCP, seen by me on 02/24/2022, HST on 04/14/2022.   Please call and notify the patient that the recent home sleep test did not show any significant obstructive sleep apnea.  Snoring ranged from mild to louder.  For disturbing snoring, an oral appliance through a dentist can be considered but may or may not be covered by his insurance.  If he would like a referral to dentistry, we can facilitate.  Treatment with a CPAP or AutoPap machine is not necessary.  If he continues to have sleepiness and would like to be evaluated further, I would recommend a overnight sleep study in the sleep lab with a next a nap study.  We can certainly talk about this further in an appointment if he would like.  Please offer follow-up appointment to talk about more in-depth sleep study evaluation for an underlying sleepiness condition such as narcolepsy, as he endorsed significant daytime somnolence and also sleepiness at the wheel.  Please remind patient not to drive when feeling sleepy.    Thanks,  Huston Foley, MD, PhD [facility])

## 2022-05-28 ENCOUNTER — Ambulatory Visit: Payer: Medicaid Other | Admitting: Family Medicine

## 2022-05-28 ENCOUNTER — Encounter: Payer: Self-pay | Admitting: Family Medicine

## 2022-05-28 VITALS — BP 111/61 | HR 63 | Temp 97.7°F | Ht 71.0 in | Wt 186.2 lb

## 2022-05-28 DIAGNOSIS — F3162 Bipolar disorder, current episode mixed, moderate: Secondary | ICD-10-CM

## 2022-05-28 DIAGNOSIS — R45851 Suicidal ideations: Secondary | ICD-10-CM | POA: Diagnosis not present

## 2022-05-28 DIAGNOSIS — F902 Attention-deficit hyperactivity disorder, combined type: Secondary | ICD-10-CM

## 2022-05-28 MED ORDER — QUETIAPINE FUMARATE 100 MG PO TABS: 100.0000 mg | ORAL_TABLET | Freq: Every day | ORAL | 1 refills | Status: DC

## 2022-05-28 NOTE — Patient Instructions (Signed)
[facility] [place] [facility]. [address] [place],  [place]  [address] Main: [facility] [place], [place] [address] HelpLine: [phone] or [phone]  Managing Depression, Adult Depression is a mental health condition that affects your thoughts, feelings, and actions. Being diagnosed with depression can bring you relief if you did not know why you have felt or behaved a certain way. It could also leave you feeling overwhelmed with uncertainty about your future. Preparing yourself to manage your symptoms can help you feel more positive about your future. How to manage lifestyle changes Managing stress  Stress is your body's reaction to life changes and events, both good and bad. Stress can add to your feelings of depression. Learning to manage your stress can help lessen your feelings of depression. Try some of the following approaches to reducing your stress (stress reduction techniques): Listen to music that you enjoy and that inspires you. Try using a meditation app or take a meditation class. Develop a practice that helps you connect with your spiritual self. Walk in nature, pray, or go to a place of worship. Do some deep breathing. To do this, inhale slowly through your nose. Pause at the top of your inhale for a few seconds and then exhale slowly, letting your muscles relax. Practice yoga to help relax and work your muscles. Choose a stress reduction technique that suits your lifestyle and personality. These techniques take time and practice to develop. Set aside 5-15 minutes a day to do them. Therapists can offer training in these techniques. Other things you can do to manage stress include: Keeping a stress diary. Knowing your limits and saying no when you think something is too much. Paying attention to how you react to certain situations. You may not be able to control everything,  but you can change your reaction. Adding humor to your life by watching funny films or TV shows. Making time for activities that you enjoy and that relax you.  Medicines Medicines, such as antidepressants, are often a part of treatment for depression. Talk with your pharmacist or health care provider about all the medicines, supplements, and herbal products that you take, their possible side effects, and what medicines and other products are safe to take together. Make sure to report any side effects you may have to your health care provider. Relationships Your health care provider may suggest family therapy, couples therapy, or individual therapy as part of your treatment. How to recognize changes Everyone responds differently to treatment for depression. As you recover from depression, you may start to: Have more interest in doing activities. Feel less hopeless. Have more energy. Overeat less often, or have a better appetite. Have better mental focus. It is important to recognize if your depression is not getting better or is getting worse. The symptoms you had in the beginning may return, such as: Tiredness (fatigue) or low energy. Eating too much or too little. Sleeping too much or too little. Feeling restless, agitated, or hopeless. Trouble focusing or making decisions. Unexplained physical complaints. Feeling irritable, angry, or aggressive. If you or your family members notice these symptoms coming back, let your health care provider know right away. Follow these instructions at home: Activity  Try to get some form of exercise each day, such as walking, biking, swimming, or lifting weights. Practice stress reduction techniques. Engage your mind by taking a class or doing some volunteer work. Lifestyle Get the right amount  and quality of sleep. Cut down on using caffeine, tobacco, alcohol, and other potentially harmful substances. Eat a healthy diet that includes plenty of  vegetables, fruits, whole grains, low-fat dairy products, and lean protein. Do not eat a lot of foods that are high in solid fats, added sugars, or salt (sodium). General instructions Take over-the-counter and prescription medicines only as told by your health care provider. Keep all follow-up visits as told by your health care provider. This is important. Where to find support Talking to others  Friends and family members can be sources of support and guidance. Talk to trusted friends or family members about your condition. Explain your symptoms to them, and let them know that you are working with a health care provider to treat your depression. Tell friends and family members how they also can be helpful. Finances Find appropriate mental health providers that fit with your financial situation. Talk with your health care provider about options to get reduced prices on your medicines. Where to find more information You can find support in your area from: Anxiety and Depression Association of America (ADAA): www.adaa.org Mental Health America: www.mentalhealthamerica.net [employer] on Mental Illness: www.nami.org Contact a health care provider if: You stop taking your antidepressant medicines, and you have any of these symptoms: Nausea. Headache. Light-headedness. Chills and body aches. Not being able to sleep (insomnia). You or your friends and family think your depression is getting worse. Get help right away if: You have thoughts of hurting yourself or others. If you ever feel like you may hurt yourself or others, or have thoughts about taking your own life, get help right away. Go to your nearest emergency department or: Call your local emergency services (911 in the [place] Call a suicide crisis helpline, such as the [facility] at 2252811637 or 988 in the [place]. This is open 24 hours a day in the [place] Text the Crisis Text Line at 418-768-8929 (in the  [place].). Summary If you are diagnosed with depression, preparing yourself to manage your symptoms is a good way to feel positive about your future. Work with your health care provider on a management plan that includes stress reduction techniques, medicines (if applicable), therapy, and healthy lifestyle habits. Keep talking with your health care provider about how your treatment is working. If you have thoughts about taking your own life, call a suicide crisis helpline or text a crisis text line. This information is not intended to replace advice given to you by your health care provider. Make sure you discuss any questions you have with your health care provider. Document Revised: 02/19/2021 Document Reviewed: 06/07/2019 Elsevier Patient Education  [place].
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
## 2022-06-09 ENCOUNTER — Encounter: Payer: Self-pay | Admitting: Neurology

## 2022-06-09 ENCOUNTER — Ambulatory Visit: Payer: Medicaid Other | Admitting: Neurology

## 2022-06-12 ENCOUNTER — Ambulatory Visit: Payer: Medicaid Other | Admitting: Family Medicine

## 2022-06-19 ENCOUNTER — Encounter: Payer: Self-pay | Admitting: Family Medicine

## 2022-06-19 ENCOUNTER — Ambulatory Visit: Payer: Medicaid Other | Admitting: Family Medicine

## 2022-06-19 VITALS — BP 118/72 | HR 79 | Temp 97.1°F | Ht 71.0 in | Wt 185.0 lb

## 2022-06-19 DIAGNOSIS — F3162 Bipolar disorder, current episode mixed, moderate: Secondary | ICD-10-CM

## 2022-06-19 DIAGNOSIS — F902 Attention-deficit hyperactivity disorder, combined type: Secondary | ICD-10-CM | POA: Diagnosis not present

## 2022-06-19 DIAGNOSIS — F1010 Alcohol abuse, uncomplicated: Secondary | ICD-10-CM

## 2022-06-19 NOTE — Progress Notes (Signed)
Established Patient Office Visit  Subjective   Patient ID: Lee Ramos, male    DOB: October 11, 1995  Age: 26 y.o. MRN: [id number]  Chief Complaint  Patient presents with   Medical Management of Chronic Issues    2 week followup of seroquel    HPI Lee Ramos is here for a follow up of bipolar disorder. He tried Seroquel for 1 dose. This did seem to help his symptoms but made him feel too drowsy. His mother also reminded him that he had nightmares when he took this as a child. He has not scheduled an appointment with psychiatry. He also reports that he is considering quitting drinking alcohol.  He drinks 12-15 drinks on the weekend and 2-3 a day during the week. He is considering quitting drinking. He drinks both beer and mixed drinks. Denies SI, HI, plan, or intent.      06/19/2022    8:39 AM 05/28/2022    9:34 AM 01/07/2022    8:14 AM  Depression screen PHQ 2/9  Decreased Interest 0 0 0  Down, Depressed, Hopeless 1 1 1   PHQ - 2 Score 1 1 1   Altered sleeping 0 0 0  Tired, decreased energy 0 0 0  Change in appetite 3 0 0  Feeling bad or failure about yourself  0 1 1  Trouble concentrating 0 1 0  Moving slowly or fidgety/restless 0 0 0  Suicidal thoughts 0 2 0  PHQ-9 Score 4 5 2   Difficult doing work/chores Somewhat difficult Somewhat difficult Somewhat difficult      06/19/2022    8:40 AM 05/28/2022    9:34 AM 01/07/2022    8:14 AM 11/26/2021   10:53 AM  GAD 7 : Generalized Anxiety Score  Nervous, Anxious, on Edge 0 1 0 0  Control/stop worrying 0 0 1 0  Worry too much - different things 0 0 1 1  Trouble relaxing 0 0 1 0  Restless 0 0 0 0  Easily annoyed or irritable 0 2 2 0  Afraid - awful might happen 0 0 1 0  Total GAD 7 Score 0 3 6 1   Anxiety Difficulty Not difficult at all Somewhat difficult Somewhat difficult Somewhat difficult       ROS As per HPI.    Objective:     BP 118/72   Pulse 79   Temp (!) 97.1 F (36.2 C)   Ht 5\' 11"  (1.803 m)   Wt 185 lb (83.9  kg)   SpO2 98%   BMI 25.80 kg/m    Physical Exam Vitals and nursing note reviewed.  Constitutional:      General: He is not in acute distress.    Appearance: Normal appearance. He is not ill-appearing, toxic-appearing or diaphoretic.  HENT:     Head: Normocephalic and atraumatic.     Nose: Nose normal.     Mouth/Throat:     Mouth: Mucous membranes are moist.     Pharynx: Oropharynx is clear.  Eyes:     General: No scleral icterus.       Right eye: No discharge.        Left eye: No discharge.     Conjunctiva/sclera: Conjunctivae normal.  Cardiovascular:     Rate and Rhythm: Normal rate and regular rhythm.     Heart sounds: Normal heart sounds. No murmur heard. Pulmonary:     Effort: Pulmonary effort is normal. No respiratory distress.     Breath sounds: Normal breath sounds.  Musculoskeletal:  Cervical back: Neck supple. No rigidity.     Right lower leg: No edema.     Left lower leg: No edema.  Skin:    General: Skin is warm and dry.  Neurological:     General: No focal deficit present.     Mental Status: He is alert and oriented to person, place, and time.  Psychiatric:        Mood and Affect: Mood normal.        Behavior: Behavior normal.        Thought Content: Thought content normal.        Judgment: Judgment normal.      No results found for any visits on 06/19/22.    The ASCVD Risk score (Arnett DK, et al., 2019) failed to calculate for the following reasons:   The 2019 ASCVD risk score is only valid for ages 35 to 75    Assessment & Plan:   Narada was seen today for medical management of chronic issues.  Diagnoses and all orders for this visit:  Bipolar disorder, current episode mixed, moderate ([facility]) ADHD (attention deficit hyperactivity disorder), combined type Alcohol abuse Discussed need for follow up with psychiatry for evaluation and medication management. Contact information given again today for patient to call and schedule appt as the  referral has been authorized. He verbalizes understanding. Denies SI, HI, plan, or intent.    Return in about 29 weeks (around 01/08/2023) for CPE, sooner for new or worsening symptoms.   The patient indicates understanding of these issues and agrees with the plan.  Gabriel Earing, FNP

## 2022-06-19 NOTE — Patient Instructions (Signed)
[facility] at [facility]. [address] [place] [address] [phone]  Call number above to schedule an appointment.

## 2022-06-29 ENCOUNTER — Telehealth: Payer: Self-pay | Admitting: Family Medicine

## 2022-06-29 NOTE — Telephone Encounter (Signed)
[facility] called stating that they received patients referral but because primary diagnosis is Alcohol Use Disorder, patient will need to be referred to [facility] for psychiatric.

## 2022-08-16 ENCOUNTER — Emergency Department (HOSPITAL_COMMUNITY)
Admission: EM | Admit: 2022-08-16 | Discharge: 2022-08-16 | Discharge status: Against Medical Advice | Payer: Medicaid Other | Attending: Emergency Medicine | Admitting: Emergency Medicine

## 2022-08-16 ENCOUNTER — Encounter (HOSPITAL_COMMUNITY): Payer: Self-pay | Admitting: *Deleted

## 2022-08-16 ENCOUNTER — Emergency Department (HOSPITAL_COMMUNITY): Payer: Medicaid Other

## 2022-08-16 ENCOUNTER — Other Ambulatory Visit: Payer: Self-pay

## 2022-08-16 DIAGNOSIS — Z5321 Procedure and treatment not carried out due to patient leaving prior to being seen by health care provider: Secondary | ICD-10-CM | POA: Insufficient documentation

## 2022-08-16 DIAGNOSIS — W228XXA Striking against or struck by other objects, initial encounter: Secondary | ICD-10-CM | POA: Diagnosis not present

## 2022-08-16 DIAGNOSIS — S60512A Abrasion of left hand, initial encounter: Secondary | ICD-10-CM | POA: Insufficient documentation

## 2022-08-16 DIAGNOSIS — S60511A Abrasion of right hand, initial encounter: Secondary | ICD-10-CM | POA: Insufficient documentation
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
## 2022-08-17 ENCOUNTER — Telehealth: Payer: Self-pay | Admitting: *Deleted

## 2022-08-17 NOTE — Telephone Encounter (Signed)
Transition Care Management Follow-up Telephone Call Date of discharge and from where: 08/16/2022 [facility] How have you been since you were released from the hospital? Doing pretty good Any questions or concerns? No  Items Reviewed: Did the pt receive and understand the discharge instructions provided? Yes  Medications obtained and verified? Yes  Other? No  Any new allergies since your discharge? No  Dietary orders reviewed? Yes Do you have support at home? Yes    Functional Questionnaire: (I = Independent and D = Dependent) ADLs: i  Bathing/Dressing- i  Meal Prep- i  Eating- i  Maintaining continence- i  Transferring/Ambulation- i  Managing Meds- i  Follow up appointments reviewed:  PCP Hospital f/u appt confirmed? No  does not feel as if he needs follow up at this time Are transportation arrangements needed? No  If their condition worsens, is the pt aware to call PCP or go to the Emergency Dept.? Yes Was the patient provided with contact information for the PCP's office or ED? Yes Was to pt encouraged to call back with questions or concerns? Yes

## 2022-10-28 ENCOUNTER — Ambulatory Visit: Payer: Medicaid Other | Admitting: Family Medicine

## 2022-10-28 ENCOUNTER — Encounter: Payer: Self-pay | Admitting: Family Medicine

## 2022-10-28 VITALS — BP 108/60 | HR 60 | Temp 98.3°F | Ht 71.0 in | Wt 194.1 lb

## 2022-10-28 DIAGNOSIS — J4541 Moderate persistent asthma with (acute) exacerbation: Secondary | ICD-10-CM | POA: Diagnosis not present

## 2022-10-28 MED ORDER — PREDNISONE 20 MG PO TABS: 20.0000 mg | ORAL_TABLET | Freq: Every day | ORAL | 0 refills | Status: AC

## 2022-10-28 MED ORDER — BUDESONIDE-FORMOTEROL FUMARATE 160-4.5 MCG/ACT IN AERO: 2.0000 | INHALATION_SPRAY | Freq: Two times a day (BID) | RESPIRATORY_TRACT | 3 refills | Status: DC

## 2022-10-28 MED ORDER — ALBUTEROL SULFATE (2.5 MG/3ML) 0.083% IN NEBU: 2.5000 mg | INHALATION_SOLUTION | Freq: Four times a day (QID) | RESPIRATORY_TRACT | 1 refills | Status: DC | PRN

## 2022-10-28 NOTE — Progress Notes (Signed)
Acute Office Visit  Subjective:     Patient ID: Lee Ramos, male    DOB: 08/28/1995, 27 y.o.   MRN: [id number]  Chief Complaint  Patient presents with   Asthma    Asthma He complains of chest tightness, cough, shortness of breath and wheezing. This is a recurrent problem. Episode onset: diagnosised with asthma as a child. Symptoms worse for last few months, worsening over last  weeks. The problem occurs daily. The problem has been waxing and waning. The cough is non-productive. Pertinent negatives include no appetite change, chest pain, ear pain, fever, headaches, heartburn, myalgias, sore throat or trouble swallowing. His symptoms are aggravated by exposure to fumes, exposure to smoke, exercise, strenuous activity and URI. His symptoms are alleviated by beta-agonist. He reports significant improvement on treatment. His symptoms are not alleviated by OTC cough suppressant and rest. His past medical history is significant for asthma.     Review of Systems  Constitutional:  Negative for appetite change and fever.  HENT:  Negative for ear pain, sore throat and trouble swallowing.   Respiratory:  Positive for cough, shortness of breath and wheezing.   Cardiovascular:  Negative for chest pain.  Gastrointestinal:  Negative for heartburn.  Musculoskeletal:  Negative for myalgias.  Neurological:  Negative for headaches.        Objective:    BP 108/60   Pulse 60   Temp 98.3 F (36.8 C) (Temporal)   Ht 5\' 11"  (1.803 m)   Wt 194 lb 2 oz (88.1 kg)   SpO2 100%   BMI 27.07 kg/m    Physical Exam Vitals and nursing note reviewed.  Constitutional:      General: He is not in acute distress.    Appearance: Normal appearance. He is not ill-appearing, toxic-appearing or diaphoretic.  HENT:     Right Ear: Tympanic membrane, ear canal and external ear normal.     Left Ear: Tympanic membrane, ear canal and external ear normal.     Nose: Nose normal.     Mouth/Throat:     Mouth:  Mucous membranes are moist.     Pharynx: Oropharynx is clear. No oropharyngeal exudate or posterior oropharyngeal erythema.  Eyes:     General:        Right eye: No discharge.        Left eye: No discharge.     Conjunctiva/sclera: Conjunctivae normal.  Cardiovascular:     Rate and Rhythm: Normal rate and regular rhythm.     Heart sounds: Normal heart sounds. No murmur heard. Pulmonary:     Effort: Pulmonary effort is normal. No respiratory distress.     Breath sounds: Normal breath sounds. No wheezing, rhonchi or rales.  Chest:     Chest wall: No tenderness.  Musculoskeletal:     Cervical back: Neck supple. No rigidity.  Lymphadenopathy:     Cervical: No cervical adenopathy.  Skin:    General: Skin is warm and dry.  Neurological:     General: No focal deficit present.     Mental Status: He is alert and oriented to person, place, and time.  Psychiatric:        Mood and Affect: Mood normal.        Behavior: Behavior normal.     No results found for any visits on 10/28/22.      Assessment & Plan:   Armonte was seen today for asthma.  Diagnoses and all orders for this visit:  Moderate persistent asthma  with (acute) exacerbation Uncontrolled. Start symbicort as below. Continue albuterol prn. Nebulizer also ordered for prn use. Prednisone burst for current exacerbation. Return to office for new or worsening symptoms, or if symptoms persist.  -     budesonide-formoterol (SYMBICORT) 160-4.5 MCG/ACT inhaler; Inhale 2 puffs into the lungs 2 (two) times daily. -     predniSONE (DELTASONE) 20 MG tablet; Take 1 tablet (20 mg total) by mouth daily with breakfast for 5 days. -     albuterol (PROVENTIL) (2.5 MG/3ML) 0.083% nebulizer solution; Take 3 mLs (2.5 mg total) by nebulization every 6 (six) hours as needed for wheezing or shortness of breath. -     For home use only DME Nebulizer machine   Return in about 2 months (around 01/08/2023) for CPE.  Gwenlyn Perking, FNP

## 2022-10-28 NOTE — Patient Instructions (Signed)
Asthma, Adult  Asthma is a long-term (chronic) condition that causes recurrent episodes in which the lower airways in the lungs become tight and narrow. The narrowing is caused by inflammation and tightening of the smooth muscle around the lower airways. Asthma episodes, also called asthma attacks or asthma flares, may cause coughing, making high-pitched whistling sounds when you breathe, most often when you breathe out (wheezing), shortness of breath, and chest pain. The airways may produce extra mucus caused by the inflammation and irritation. During an attack, it can be difficult to breathe. Asthma attacks can range from minor to life-threatening. Asthma cannot be cured, but medicines and lifestyle changes can help control it and treat acute attacks. It is important to keep your asthma well controlled so the condition does not interfere with your daily life. What are the causes? This condition is believed to be caused by inherited (genetic) and environmental factors, but its exact cause is not known. What can trigger an asthma attack? Many things can bring on an asthma attack or make symptoms worse. These triggers are different for every person. Common triggers include: Allergens and irritants like mold, dust, pet dander, cockroaches, pollen, air pollution, and chemical odors. Cigarette smoke. Weather changes and cold air. Stress and strong emotional responses such as crying or laughing hard. Certain medications such as aspirin or beta blockers. Infections and inflammatory conditions, such as the flu, a cold, pneumonia, or inflammation of the nasal membranes (rhinitis). Gastroesophageal reflux disease (GERD). What are the signs or symptoms? Symptoms may occur right after exposure to an asthma trigger or hours later and can vary by person. Common signs and symptoms include: Wheezing. Trouble breathing (shortness of breath). Excessive nighttime or early morning coughing. Chest  tightness. Tiredness (fatigue) with minimal activity. Difficulty talking in complete sentences. Poor exercise tolerance. How is this diagnosed? This condition is diagnosed based on: A physical exam and your medical history. Tests, which may include: Lung function studies to evaluate the flow of air in your lungs. Allergy tests. Imaging tests, such as X-rays. How is this treated? There is no cure, but symptoms can be controlled with proper treatment. Treatment usually involves: Identifying and avoiding your asthma triggers. Inhaled medicines. Two types are commonly used to treat asthma, depending on severity: Controller medicines. These help prevent asthma symptoms from occurring. They are taken every day. Fast-acting reliever or rescue medicines. These quickly relieve asthma symptoms. They are used as needed and provide short-term relief. Using other medicines, such as: Allergy medicines, such as antihistamines, if your asthma attacks are triggered by allergens. Immune medicines (immunomodulators). These are medicines that help control the immune system. Using supplemental oxygen. This is only needed during a severe episode. Creating an asthma action plan. An asthma action plan is a written plan for managing and treating your asthma attacks. This plan includes: A list of your asthma triggers and how to avoid them. Information about when medicines should be taken and when their dosage should be changed. Instructions about using a device called a peak flow meter. A peak flow meter measures how well the lungs are working and the severity of your asthma. It helps you monitor your condition. Follow these instructions at home: Take over-the-counter and prescription medicines only as told by your health care provider. Stay up to date on all vaccinations as recommended by your healthcare provider, including vaccines for the flu and pneumonia. Use a peak flow meter and keep track of your peak flow  readings. Understand and use your asthma   action plan to address any asthma flares. Do not smoke or allow anyone to smoke in your home. Contact a health care provider if: You have wheezing, shortness of breath, or a cough that is not responding to medicines. Your medicines are causing side effects, such as a rash, itching, swelling, or trouble breathing. You need to use a reliever medicine more than 2-3 times a week. Your peak flow reading is still at 50-79% of your personal best after following your action plan for 1 hour. You have a fever and shortness of breath. Get help right away if: You are getting worse and do not respond to treatment during an asthma attack. You are short of breath when at rest or when doing very little physical activity. You have difficulty eating, drinking, or talking. You have chest pain or tightness. You develop a fast heartbeat or palpitations. You have a bluish color to your lips or fingernails. You are light-headed or dizzy, or you faint. Your peak flow reading is less than 50% of your personal best. You feel too tired to breathe normally. These symptoms may be an emergency. Get help right away. Call 911. Do not wait to see if the symptoms will go away. Do not drive yourself to the hospital. Summary Asthma is a long-term (chronic) condition that causes recurrent episodes in which the airways become tight and narrow. Asthma episodes, also called asthma attacks or asthma flares, can cause coughing, wheezing, shortness of breath, and chest pain. Asthma cannot be cured, but medicines and lifestyle changes can help keep it well controlled and prevent asthma flares. Make sure you understand how to avoid triggers and how and when to use your medicines. Asthma attacks can range from minor to life-threatening. Get help right away if you have an asthma attack and do not respond to treatment with your usual rescue medicines. This information is not intended to replace  advice given to you by your health care provider. Make sure you discuss any questions you have with your health care provider. Document Revised: 05/14/2021 Document Reviewed: 05/05/2021 Elsevier Patient Education  2023 [employer]  
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
## 2022-12-15 IMAGING — DX DG KNEE 1-2V*R*
2 series · 2 of 2 positions shown · non-contrast
Comparison: None.

CLINICAL DATA: Chronic right knee pain.

EXAM:
RIGHT KNEE - 1-2 VIEW

[knee ap]
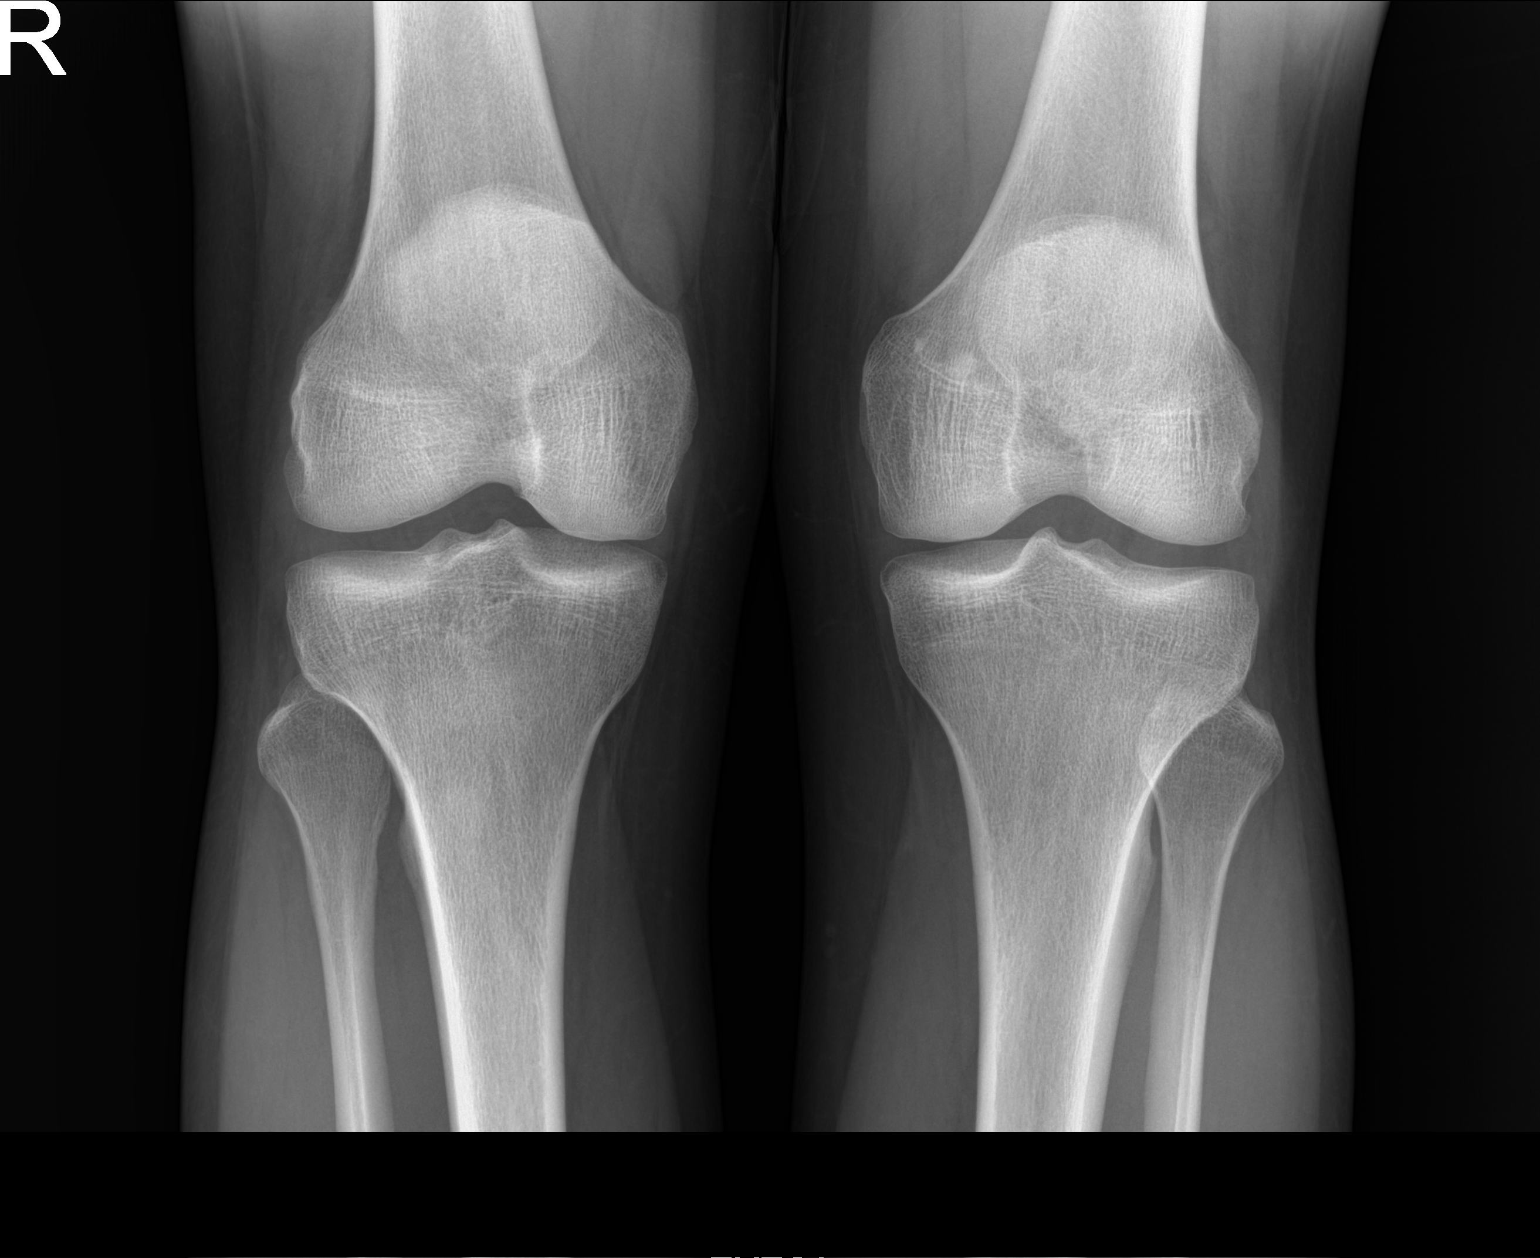

[knee lat]
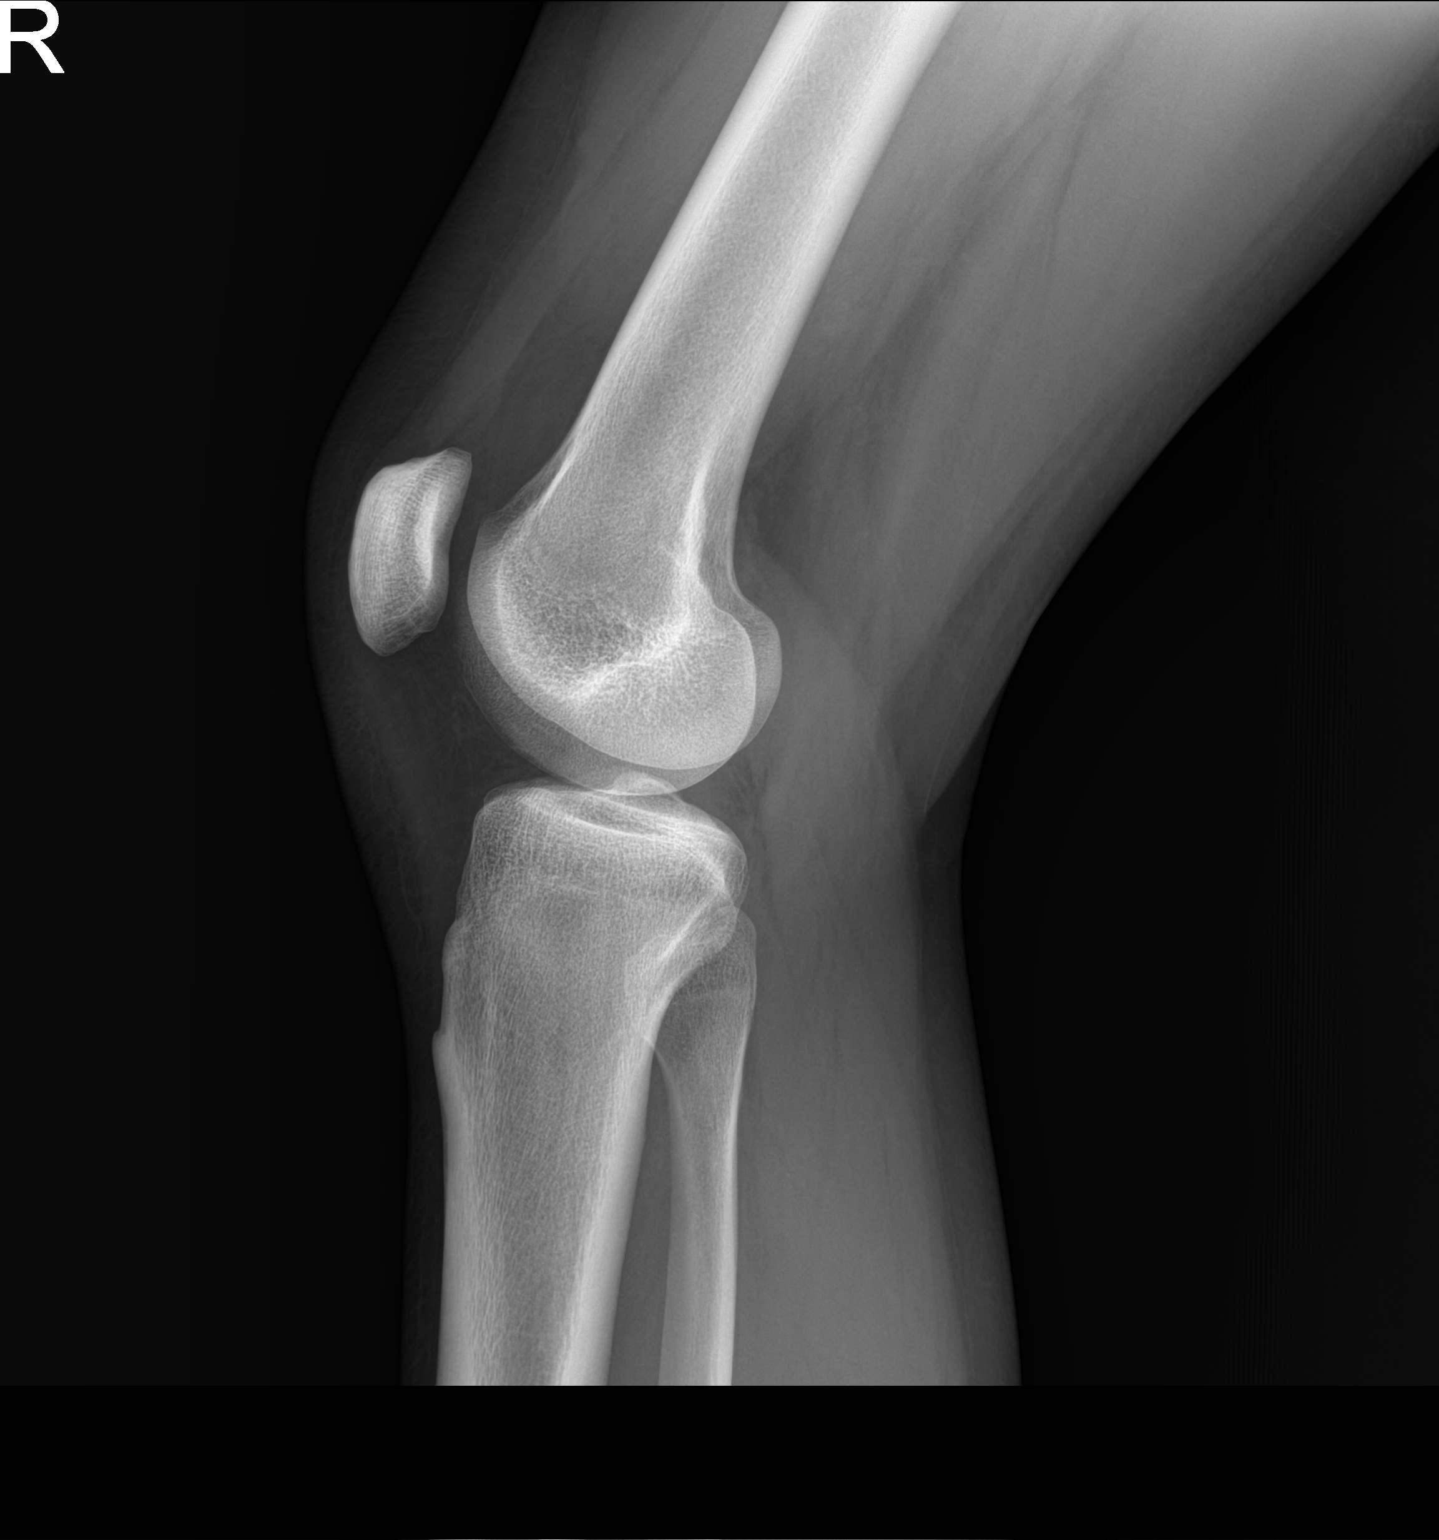

[2 of 2 positions shown; findings below may reference images not displayed]

FINDINGS: No evidence of fracture, dislocation, or joint effusion. No evidence
of arthropathy or other focal bone abnormality. Soft tissues are
unremarkable.
IMPRESSION: Negative.

## 2023-01-14 ENCOUNTER — Ambulatory Visit: Payer: Medicaid Other | Admitting: Family Medicine

## 2023-01-15 ENCOUNTER — Encounter: Payer: Self-pay | Admitting: Family Medicine

## 2023-02-24 ENCOUNTER — Encounter: Payer: Medicaid Other | Admitting: Family Medicine

## 2023-03-24 ENCOUNTER — Encounter (HOSPITAL_COMMUNITY): Payer: Self-pay

## 2023-03-24 ENCOUNTER — Emergency Department (HOSPITAL_COMMUNITY): Payer: Medicaid Other

## 2023-03-24 ENCOUNTER — Other Ambulatory Visit: Payer: Self-pay

## 2023-03-24 ENCOUNTER — Emergency Department (HOSPITAL_COMMUNITY)
Admission: EM | Admit: 2023-03-24 | Discharge: 2023-03-25 | Disposition: A | Discharge status: Home / Self Care | Payer: Medicaid Other | Attending: Emergency Medicine | Admitting: Emergency Medicine

## 2023-03-24 DIAGNOSIS — S8992XA Unspecified injury of left lower leg, initial encounter: Secondary | ICD-10-CM | POA: Diagnosis present

## 2023-03-24 DIAGNOSIS — W293XXA Contact with powered garden and outdoor hand tools and machinery, initial encounter: Secondary | ICD-10-CM | POA: Diagnosis not present

## 2023-03-24 DIAGNOSIS — S81812A Laceration without foreign body, left lower leg, initial encounter: Secondary | ICD-10-CM

## 2023-03-24 DIAGNOSIS — S81012A Laceration without foreign body, left knee, initial encounter: Secondary | ICD-10-CM | POA: Insufficient documentation

## 2023-03-24 NOTE — ED Notes (Signed)
Small laceration noted to left knee. No active bleeding.  Wound cleansed. Kellogg RN
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
## 2023-03-25 MED ORDER — IBUPROFEN 800 MG PO TABS: 800.0000 mg | ORAL_TABLET | Freq: Four times a day (QID) | ORAL | 0 refills | Status: DC | PRN

## 2023-03-25 MED ORDER — DOXYCYCLINE HYCLATE 100 MG PO CAPS: 100.0000 mg | ORAL_CAPSULE | Freq: Two times a day (BID) | ORAL | 0 refills | Status: DC

## 2023-03-25 MED ORDER — DOXYCYCLINE HYCLATE 100 MG PO TABS
100.0000 mg | ORAL_TABLET | Freq: Once | ORAL | Status: AC
  Administered 2023-03-25: 100 mg via ORAL
  Filled 2023-03-25: qty 1

## 2023-03-25 NOTE — ED Provider Notes (Signed)
[place] EMERGENCY DEPARTMENT AT [facility] Provider Note   CSN: [id number] Arrival date & time: 03/24/23  2259     History  Chief Complaint  Patient presents with   Leg Injury   Laceration    Lee Ramos is a 27 y.o. male.  Patient presents to the emergency department for evaluation of laceration to left knee.  Patient injured the left knee with a chainsaw around 3:30 PM today.  Tetanus is up-to-date.  Patient with pain in the area of the wound.       Home Medications Prior to Admission medications   Medication Sig Start Date End Date Taking? Authorizing Provider  doxycycline (VIBRAMYCIN) 100 MG capsule Take 1 capsule (100 mg total) by mouth 2 (two) times daily. 03/25/23  Yes Osa Fogarty, Canary Brim, MD  ibuprofen (ADVIL) 800 MG tablet Take 1 tablet (800 mg total) by mouth every 6 (six) hours as needed for moderate pain. 03/25/23  Yes Gillermo Poch, Canary Brim, MD  acetaminophen (TYLENOL) 325 MG tablet Take 650 mg by mouth every 6 (six) hours as needed.    [provider]  albuterol (PROAIR HFA) 108 (90 Base) MCG/ACT inhaler Inhale 1-2 puffs into the lungs every 6 (six) hours as needed for wheezing or shortness of breath. 11/26/21   Gabriel Earing, FNP  albuterol (PROVENTIL) (2.5 MG/3ML) 0.083% nebulizer solution Take 3 mLs (2.5 mg total) by nebulization every 6 (six) hours as needed for wheezing or shortness of breath. 10/28/22   Gabriel Earing, FNP  budesonide-formoterol (SYMBICORT) 160-4.5 MCG/ACT inhaler Inhale 2 puffs into the lungs 2 (two) times daily. 10/28/22   Gabriel Earing, FNP      Allergies    Pepto-bismol  [bismuth] and Nickel    Review of Systems   Review of Systems  Physical Exam Updated Vital Signs BP 123/71   Pulse 73   Resp 18   SpO2 97%  Physical Exam Vitals reviewed.  Constitutional:      Appearance: Normal appearance.  Musculoskeletal:        General: Normal range of motion.     Left knee: Laceration (1.5 cm over  mid patellar region) present. No swelling, effusion, erythema or ecchymosis. Normal range of motion. Tenderness present.  Skin:    Findings: Laceration present.  Neurological:     Mental Status: He is alert.     Sensory: Sensation is intact.     Motor: Motor function is intact.     ED Results / Procedures / Treatments   Labs (all labs ordered are listed, but only abnormal results are displayed) Labs Reviewed - No data to display  EKG None  Radiology DG Knee Left Port  Result Date: 03/24/2023 CLINICAL DATA:  Left knee laceration after using chainsaw. EXAM: PORTABLE LEFT KNEE - 1-2 VIEW COMPARISON:  None Available. FINDINGS: No evidence of fracture, dislocation, or joint effusion. No evidence of arthropathy or other focal bone abnormality. No radiopaque foreign body is seen. Bandage material is noted over the anterior knee. IMPRESSION: No acute osseous abnormality or radiopaque foreign body. Electronically Signed   By: Thornell Sartorius M.D.   On: 03/24/2023 23:43    Procedures Procedures    Medications Ordered in ED Medications  doxycycline (VIBRA-TABS) tablet 100 mg (has no administration in time range)    ED Course/ Medical Decision Making/ A&P  Medical Decision Making Amount and/or Complexity of Data Reviewed Radiology: ordered.   Patient with small laceration over the mid patellar region of the left knee.  X-ray negative, no bony involvement.  No active bleeding.  Wound occurred many hours ago and is likely to have some contamination.  Discussed with the patient that I would prefer not to close the wound.  It is not bleeding currently.  Will provide local wound care.  Initiate antibiotic coverage.  Given return precautions.        Final Clinical Impression(s) / ED Diagnoses Final diagnoses:  Laceration of left lower extremity, initial encounter    Rx / DC Orders ED Discharge Orders          Ordered    doxycycline (VIBRAMYCIN) 100  MG capsule  2 times daily        03/25/23 0056    ibuprofen (ADVIL) 800 MG tablet  Every 6 hours PRN        03/25/23 0056              Gilda Crease, MD 03/25/23 [phone]

## 2023-05-16 ENCOUNTER — Emergency Department (HOSPITAL_BASED_OUTPATIENT_CLINIC_OR_DEPARTMENT_OTHER): Payer: Medicaid Other | Admitting: Radiology

## 2023-05-16 ENCOUNTER — Emergency Department (HOSPITAL_BASED_OUTPATIENT_CLINIC_OR_DEPARTMENT_OTHER)
Admission: EM | Admit: 2023-05-16 | Discharge: 2023-05-16 | Disposition: A | Discharge status: Home / Self Care | Payer: Medicaid Other | Attending: Emergency Medicine | Admitting: Emergency Medicine

## 2023-05-16 ENCOUNTER — Other Ambulatory Visit: Payer: Self-pay

## 2023-05-16 ENCOUNTER — Encounter (HOSPITAL_BASED_OUTPATIENT_CLINIC_OR_DEPARTMENT_OTHER): Payer: Self-pay

## 2023-05-16 DIAGNOSIS — M25511 Pain in right shoulder: Secondary | ICD-10-CM | POA: Diagnosis present

## 2023-05-16 MED ORDER — MELOXICAM 15 MG PO TABS: 15.0000 mg | ORAL_TABLET | Freq: Every day | ORAL | 0 refills | Status: DC

## 2023-05-16 MED ORDER — CYCLOBENZAPRINE HCL 10 MG PO TABS: 10.0000 mg | ORAL_TABLET | Freq: Two times a day (BID) | ORAL | 0 refills | Status: DC | PRN

## 2023-05-16 MED ORDER — KETOROLAC TROMETHAMINE 60 MG/2ML IM SOLN
30.0000 mg | Freq: Once | INTRAMUSCULAR | Status: AC
  Administered 2023-05-16: 30 mg via INTRAMUSCULAR
  Filled 2023-05-16: qty 2

## 2023-05-16 MED ORDER — CYCLOBENZAPRINE HCL 10 MG PO TABS
10.0000 mg | ORAL_TABLET | Freq: Once | ORAL | Status: AC
  Administered 2023-05-16: 10 mg via ORAL
  Filled 2023-05-16: qty 1

## 2023-05-16 NOTE — ED Provider Notes (Signed)
[place] EMERGENCY DEPARTMENT AT [facility] Provider Note   CSN: [id number] Arrival date & time: 05/16/23  2115     History  Chief Complaint  Patient presents with   Shoulder Injury    Lee Ramos is a 27 y.o. male.  27 year old male that has had multiple issues with his right shoulder in the past of his spine to lift something tonight and felt pain and heard a pop.  No weakness just pain now when he tries to lift things.  He is already in a sling on examination.  States has had issues in the past and been told he has rotator cuff issues and might need surgery or develop arthritis and might need an MRI.  No other injuries.   Shoulder Injury       Home Medications Prior to Admission medications   Medication Sig Start Date End Date Taking? Authorizing Provider  cyclobenzaprine (FLEXERIL) 10 MG tablet Take 1 tablet (10 mg total) by mouth 2 (two) times daily as needed for muscle spasms. 05/16/23  Yes Jermiya Reichl, Barbara Cower, MD  meloxicam (MOBIC) 15 MG tablet Take 1 tablet (15 mg total) by mouth daily. 05/16/23  Yes Quavion Boule, Barbara Cower, MD  acetaminophen (TYLENOL) 325 MG tablet Take 650 mg by mouth every 6 (six) hours as needed.    [provider]  albuterol (PROAIR HFA) 108 (90 Base) MCG/ACT inhaler Inhale 1-2 puffs into the lungs every 6 (six) hours as needed for wheezing or shortness of breath. 11/26/21   Gabriel Earing, FNP  albuterol (PROVENTIL) (2.5 MG/3ML) 0.083% nebulizer solution Take 3 mLs (2.5 mg total) by nebulization every 6 (six) hours as needed for wheezing or shortness of breath. 10/28/22   Gabriel Earing, FNP  budesonide-formoterol (SYMBICORT) 160-4.5 MCG/ACT inhaler Inhale 2 puffs into the lungs 2 (two) times daily. 10/28/22   Gabriel Earing, FNP  doxycycline (VIBRAMYCIN) 100 MG capsule Take 1 capsule (100 mg total) by mouth 2 (two) times daily. 03/25/23   Gilda Crease, MD  ibuprofen (ADVIL) 800 MG tablet Take 1 tablet (800 mg total) by mouth  every 6 (six) hours as needed for moderate pain. 03/25/23   Gilda Crease, MD      Allergies    Pepto-bismol  [bismuth] and Nickel    Review of Systems   Review of Systems  Physical Exam Updated Vital Signs BP 126/89 (BP Location: Right Arm)   Pulse 62   Temp 97.9 F (36.6 C)   Resp 18   Ht 5\' 11"  (1.803 m)   Wt 88.5 kg   SpO2 100%   BMI 27.20 kg/m  Physical Exam Vitals and nursing note reviewed.  Constitutional:      Appearance: He is well-developed.  HENT:     Head: Normocephalic and atraumatic.  Eyes:     Pupils: Pupils are equal, round, and reactive to light.  Cardiovascular:     Rate and Rhythm: Normal rate.  Pulmonary:     Effort: Pulmonary effort is normal. No respiratory distress.  Abdominal:     General: There is no distension.  Musculoskeletal:        General: Normal range of motion.     Cervical back: Normal range of motion.     Comments: Exam very limited by pain however does seem to have some weakness in his abduction and abduction of his shoulder.  Pulses intact and distal arm grip strength seems appropriate.  Skin:    General: Skin is warm and dry.  Neurological:     General: No focal deficit present.     Mental Status: He is alert.     ED Results / Procedures / Treatments   Labs (all labs ordered are listed, but only abnormal results are displayed) Labs Reviewed - No data to display  EKG None  Radiology DG Shoulder Right  Result Date: 05/16/2023 CLINICAL DATA:  Right shoulder injury picking up a heavy object. EXAM: RIGHT SHOULDER - 2+ VIEW COMPARISON:  None Available. FINDINGS: There is no evidence of fracture or dislocation. There is no evidence of arthropathy or other focal bone abnormality. Soft tissues are unremarkable. IMPRESSION: Negative. Electronically Signed   By: Almira Bar M.D.   On: 05/16/2023 23:24    Procedures Procedures    Medications Ordered in ED Medications  ketorolac (TORADOL) injection 30 mg (30 mg  Intramuscular Given 05/16/23 2353)  cyclobenzaprine (FLEXERIL) tablet 10 mg (10 mg Oral Given 05/16/23 2353)    ED Course/ Medical Decision Making/ A&P                                 Medical Decision Making Amount and/or Complexity of Data Reviewed Radiology: ordered.  Risk Prescription drug management.   Shoulder x-ray viewed and interpreted by myself as no dislocation of any visualized joints no obvious fractures.  Consider rotator cuff tear/strain/ligamentous injury.  Possibly subluxed to and self reduced however seems little bit less likely.  He is already in a sling will treat with anti-inflammatories and muscle relaxers and refer to Ortho for further workup and consideration of further imaging. Work note provided.    Final Clinical Impression(s) / ED Diagnoses Final diagnoses:  Acute pain of right shoulder    Rx / DC Orders ED Discharge Orders          Ordered    meloxicam (MOBIC) 15 MG tablet  Daily        05/16/23 2354    cyclobenzaprine (FLEXERIL) 10 MG tablet  2 times daily PRN        05/16/23 2354    Ambulatory referral to Orthopedic Surgery        05/16/23 2354              Jacky Hartung, Barbara Cower, MD 05/16/23 2357

## 2023-05-16 NOTE — ED Triage Notes (Addendum)
Pt presents with R shoulder injury after picking up a heavy stone object. Pt states he thinks he had a torn rotator cuff on the R side, but attempted to lift the object anyway. Pt noted a "pop" in his R shoulder followed by sharp pain. 2+ radial pulse present in RUE.

## 2023-08-20 ENCOUNTER — Ambulatory Visit: Payer: Medicaid Other | Admitting: Family Medicine

## 2023-08-23 ENCOUNTER — Encounter: Payer: Self-pay | Admitting: Family Medicine

## 2023-08-23 ENCOUNTER — Ambulatory Visit: Payer: Medicaid Other | Admitting: Family Medicine

## 2023-08-23 VITALS — BP 118/73 | HR 83 | Temp 97.9°F | Ht 71.0 in | Wt 200.2 lb

## 2023-08-23 DIAGNOSIS — F419 Anxiety disorder, unspecified: Secondary | ICD-10-CM

## 2023-08-23 DIAGNOSIS — F339 Major depressive disorder, recurrent, unspecified: Secondary | ICD-10-CM | POA: Insufficient documentation

## 2023-08-23 DIAGNOSIS — J4541 Moderate persistent asthma with (acute) exacerbation: Secondary | ICD-10-CM | POA: Diagnosis not present

## 2023-08-23 DIAGNOSIS — R45851 Suicidal ideations: Secondary | ICD-10-CM | POA: Diagnosis not present

## 2023-08-23 MED ORDER — BUPROPION HCL ER (XL) 150 MG PO TB24
150.0000 mg | ORAL_TABLET | Freq: Every day | ORAL | 1 refills | Status: DC
Start: 2023-08-23 — End: 2024-05-31

## 2023-08-23 MED ORDER — BUDESONIDE-FORMOTEROL FUMARATE 160-4.5 MCG/ACT IN AERO
2.0000 | INHALATION_SPRAY | Freq: Two times a day (BID) | RESPIRATORY_TRACT | 3 refills | Status: DC
Start: 2023-08-23 — End: 2024-01-31

## 2023-08-23 MED ORDER — ALBUTEROL SULFATE HFA 108 (90 BASE) MCG/ACT IN AERS: 1.0000 | INHALATION_SPRAY | Freq: Four times a day (QID) | RESPIRATORY_TRACT | 3 refills | Status: DC | PRN

## 2023-08-23 NOTE — Progress Notes (Addendum)
 Acute Office Visit  Subjective:     Patient ID: Lee Ramos, male    DOB: 1996-04-22, 28 y.o.   MRN: [id number]  Chief Complaint  Patient presents with   Asthma   Depression    HPI Patient is in today for increased anxiety and depression for the last few months. He reports being on abilify for awhile as a child and isn't sure if it was helpful. He has been falling asleep but then waking up very early and unable to go back to sleep. He has started smoking marijuana to help manage his symptoms but is working on cessation. Reports the passing of his daughter in May of last year. His father passed about 1 months ago and his symptoms have worsened since. Reports passive SI but denies a plan. Interested in a referral for counseling and medication management. Hx of ADHD as a child. Reported hx of bipolar disorder, no documentation on file for this.   He has been out of his symbicort for about 1 months. Increased asthma symptoms since then with wheezing most days. Also reports shortness of breath with activity. Intermittent coughing. Reports that his symptoms were well controlled while on symbicort. Would like referral as he has not had PFT testing since he was a child.      08/23/2023    3:19 PM 10/28/2022    9:04 AM 06/19/2022    8:39 AM  Depression screen PHQ 2/9  Decreased Interest 0 0 0  Down, Depressed, Hopeless 3 0 1  PHQ - 2 Score 3 0 1  Altered sleeping 3 0 0  Tired, decreased energy 2 0 0  Change in appetite 3 3 3   Feeling bad or failure about yourself  3 0 0  Trouble concentrating 0 0 0  Moving slowly or fidgety/restless 0 0 0  Suicidal thoughts 2 0 0  PHQ-9 Score 16 3 4   Difficult doing work/chores Somewhat difficult Not difficult at all Somewhat difficult      08/23/2023    3:19 PM 10/28/2022    9:05 AM 06/19/2022    8:40 AM 05/28/2022    9:34 AM  GAD 7 : Generalized Anxiety Score  Nervous, Anxious, on Edge 1 0 0 1  Control/stop worrying 3 0 0 0  Worry too much -  different things 1 0 0 0  Trouble relaxing 0 0 0 0  Restless 1 0 0 0  Easily annoyed or irritable 1 1 0 2  Afraid - awful might happen 0 0 0 0  Total GAD 7 Score 7 1 0 3  Anxiety Difficulty Somewhat difficult Somewhat difficult Not difficult at all Somewhat difficult     ROS As per HPI.      Objective:    BP 118/73   Pulse 83   Temp 97.9 F (36.6 C) (Temporal)   Ht 5\' 11"  (1.803 m)   Wt 200 lb 3.2 oz (90.8 kg)   SpO2 98%   BMI 27.92 kg/m    Physical Exam Vitals and nursing note reviewed.  Constitutional:      General: He is not in acute distress.    Appearance: Normal appearance. He is not ill-appearing, toxic-appearing or diaphoretic.  Cardiovascular:     Rate and Rhythm: Regular rhythm.     Heart sounds: Normal heart sounds. No murmur heard. Pulmonary:     Effort: Pulmonary effort is normal. No respiratory distress.     Breath sounds: Normal breath sounds. No wheezing, rhonchi or rales.  Musculoskeletal:     Right lower leg: No edema.     Left lower leg: No edema.  Skin:    General: Skin is warm and dry.  Neurological:     General: No focal deficit present.     Mental Status: He is alert and oriented to person, place, and time.  Psychiatric:        Mood and Affect: Mood normal.        Behavior: Behavior normal.        Thought Content: Thought content normal.        Judgment: Judgment normal.     No results found for any visits on 08/23/23.      Assessment & Plan:   Lee "Vincenza Hews" was seen today for asthma and depression.  Diagnoses and all orders for this visit:  Moderate persistent asthma with (acute) exacerbation Uncontrolled. Restart symbicort. Albuterol prn. Referral to pulmonary discussed and ordered.  -     budesonide-formoterol (SYMBICORT) 160-4.5 MCG/ACT inhaler; Inhale 2 puffs into the lungs 2 (two) times daily. -     Ambulatory referral to Pulmonology  Depression, recurrent (HCC) Passive suicidal ideations Anxiety Uncontrolled. He  is able to verbally contract for safety today. Start wellbutrin as below. Referrals discussed and placed as below. Aware to seek emergency care for worsening symptoms.  Patient verbally consented to [facility] services about presenting concerns and psychiatric consultation as appropriate.  The services will be billed as appropriate for the patient -     buPROPion (WELLBUTRIN XL) 150 MG 24 hr tablet; Take 1 tablet (150 mg total) by mouth daily. -     Ambulatory referral to Integrated Behavioral Health -     Ambulatory referral to Psychiatry   Return in about 2 weeks (around 09/06/2023), or if symptoms worsen or fail to improve, for medication follow up.  The patient indicates understanding of these issues and agrees with the plan.  Gabriel Earing, FNP

## 2023-08-23 NOTE — Patient Instructions (Signed)
Suicidal Feelings: How to Help Yourself Suicide is when you end your own life. Suicidal ideation includes expressing thoughts about, or a preoccupation with, ending your own life. There are many things you can do to help yourself feel better when struggling with these feelings. Many services and people are available to support you and others who struggle with similar feelings. If you ever feel like you may hurt yourself or others, or have thoughts about taking your own life, get help right away. To get help: Go to your nearest emergency department. Call your local emergency services (911 in the [place] Call the [employer]'s health and human services helpline (211 in the [place] Call or text a suicide hotline to speak with a trained counselor. The following suicide hotlines are available in the [place]: 1-800-273-TALK (1-800-273-8255 or 988 in the [place] 1-800-SUICIDE (1-800-784-2433). Text 741741. This is the Crisis Text Line in the [place] 1-888-628-9454. This is a hotline for Spanish speakers. 1-800-799-4889. This is a hotline for TTY users. 1-866-4-U-TREVOR (1-866-488-7386). This is a hotline for lesbian, gay, bisexual, transgender, or questioning youth. For a list of hotlines in [place], visit suicide.org/hotlines/international/[place]-suicide-hotlines.html Contact a crisis center or a local suicide prevention center. To find a crisis center or suicide prevention center: Call your local hospital, clinic, community service organization, mental health center, social service provider, or health department. Ask for help with connecting to a crisis center. For a list of crisis centers in the [place], visit: suicidepreventionlifeline.org For a list of crisis centers in [place], visit: suicideprevention.ca How to help yourself feel better  Promise yourself that you will not do anything bad or extreme when you have suicidal feelings. Remember the times you have felt hopeful. Many people have  gotten through suicidal thoughts and feelings, and you can too. If you have had these feelings before, remind yourself that you can get through them again. Let family, friends, teachers, or counselors know how you are feeling. Do not separate yourself from those who care about you and want to help you. Talk with someone every day, even if you do not feel like talking to anyone or being with other people. Face-to-face conversation is best to help them understand your feelings. Contact a mental health care provider and work with this person regularly. Make a safety plan that you can follow during a crisis. Include phone numbers of suicide prevention hotlines, mental health professionals, and trusted friends and family members you can call during an emergency. Save these numbers on your phone. If you are thinking of taking a lot of medicine, give your medicine to someone who can give it to you as prescribed. If you are on antidepressants and are concerned you will overdose, tell your health care provider so that he or she can give you safer medicines. Try to stick to your routines and follow a schedule every day. Make self-care a priority. Make a list of realistic goals, and cross them off when you achieve them. Accomplishments can give you a sense of worth. Wait until you are feeling better before doing things that you find difficult or unpleasant. Do things that you have always enjoyed to take your mind off your feelings. Try reading a book, or listening to or playing music. Spending time outside, in nature, may help you feel better. Follow these instructions at home:  Visit your primary health care provider every year for a physical and a mental health checkup. Take over-the-counter and prescription medicines only as told by your health care   provider. Ask your health care provider about the possible side effects of any medicines you are taking. Ask your health care provider about whether  suicidal ideation is a possible side effect of any of your medicines. Learn about suicidal ideation and what increases the risk for the development of suicidal thoughts. Eat a well-balanced diet, and eat regular meals. Get plenty of rest. Exercise if you are able. Just 30 minutes of exercise each day can help you feel better. Keep your living space well lit. Do not use alcohol or drugs. Remove these substances from your home. General recommendations Remove weapons, poisons, knives, and other deadly items from your home. Work with a mental health care provider as needed. When you are feeling well, write yourself a letter with tips and support that you can read when you are not feeling well. Remember that life's difficulties can be sorted out with help. Conditions can be treated, and you can learn behaviors and ways of thinking that will help you. Work with your health care provider or counselor to learn ways of coping with your thoughts and feelings. Where to find more information National Suicide Prevention Lifeline: www.suicidepreventionlifeline.org Hopeline: www.hopeline.com [employer] for Suicide Prevention: https://www.ayers.com/ [employer] (for lesbian, gay, bisexual, transgender, or questioning youth): www.thetrevorproject.[employer] of Mental Health: https://ramirez-williams.com/ Suicide Prevention Resources: FarmerBuys.com.au Contact a health care provider if: You feel as though you are a burden to others. You feel agitated, angry, vengeful, or have extreme mood swings. You have withdrawn from family and friends. You are frequently using drugs or alcohol. Get help right away if: You are talking about suicide or wishing to die. You start making plans for how to commit suicide. You feel that you have no reason to live. You start making plans for putting your affairs in order, saying goodbye, or giving your possessions  away. You feel guilt, shame, or unbearable pain, and it seems like there is no way out. You are engaging in risky behaviors that could lead to death. If you have any of these thoughts or symptoms, get help right away: Go to your nearest emergency department or crisis center. Call emergency services (911 in the [place] Call or text a suicide crisis helpline. Summary Suicide is when you take your own life. Suicidal feelings are thoughts about ending your own life. Promise yourself that you will not do anything bad or extreme when you have suicidal feelings. Let family, friends, teachers, or counselors know how you are feeling. Get help right away if you start making plans for how to commit suicide. This information is not intended to replace advice given to you by your health care provider. Make sure you discuss any questions you have with your health care provider. Document Revised: 02/20/2021 Document Reviewed: 12/05/2020 Elsevier Patient Education  2024 [employer].

## 2023-08-29 ENCOUNTER — Other Ambulatory Visit: Payer: Self-pay

## 2023-08-29 ENCOUNTER — Emergency Department (HOSPITAL_COMMUNITY): Payer: Medicaid Other

## 2023-08-29 ENCOUNTER — Encounter (HOSPITAL_COMMUNITY): Payer: Self-pay

## 2023-08-29 ENCOUNTER — Emergency Department (HOSPITAL_COMMUNITY)
Admission: EM | Admit: 2023-08-29 | Discharge: 2023-08-30 | Disposition: A | Discharge status: Home / Self Care | Payer: Medicaid Other | Attending: Emergency Medicine | Admitting: Emergency Medicine

## 2023-08-29 DIAGNOSIS — J45909 Unspecified asthma, uncomplicated: Secondary | ICD-10-CM | POA: Diagnosis not present

## 2023-08-29 DIAGNOSIS — J101 Influenza due to other identified influenza virus with other respiratory manifestations: Secondary | ICD-10-CM | POA: Insufficient documentation

## 2023-08-29 DIAGNOSIS — Z1152 Encounter for screening for COVID-19: Secondary | ICD-10-CM | POA: Diagnosis not present

## 2023-08-29 DIAGNOSIS — R059 Cough, unspecified: Secondary | ICD-10-CM | POA: Diagnosis present

## 2023-08-29 LAB — RESP PANEL BY RT-PCR (RSV, FLU A&B, COVID)  RVPGX2
Influenza A by PCR: POSITIVE — AB
Influenza B by PCR: NEGATIVE
Resp Syncytial Virus by PCR: NEGATIVE
SARS Coronavirus 2 by RT PCR: NEGATIVE

## 2023-08-29 MED ORDER — ALBUTEROL SULFATE (2.5 MG/3ML) 0.083% IN NEBU
2.5000 mg | INHALATION_SOLUTION | Freq: Once | RESPIRATORY_TRACT | Status: AC
  Administered 2023-08-29: 2.5 mg via RESPIRATORY_TRACT
  Filled 2023-08-29: qty 3

## 2023-08-29 MED ORDER — OSELTAMIVIR PHOSPHATE 75 MG PO CAPS: 75.0000 mg | ORAL_CAPSULE | Freq: Two times a day (BID) | ORAL | 0 refills | Status: DC

## 2023-08-29 MED ORDER — ACETAMINOPHEN 500 MG PO TABS
1000.0000 mg | ORAL_TABLET | Freq: Once | ORAL | Status: AC
  Administered 2023-08-30: 1000 mg via ORAL
  Filled 2023-08-29: qty 2

## 2023-08-29 MED ORDER — LACTATED RINGERS IV BOLUS
1000.0000 mL | Freq: Once | INTRAVENOUS | Status: AC
  Administered 2023-08-30: 1000 mL via INTRAVENOUS

## 2023-08-29 MED ORDER — IBUPROFEN 400 MG PO TABS
600.0000 mg | ORAL_TABLET | Freq: Once | ORAL | Status: AC
  Administered 2023-08-29: 600 mg via ORAL
  Filled 2023-08-29: qty 2

## 2023-08-29 MED ORDER — IPRATROPIUM-ALBUTEROL 0.5-2.5 (3) MG/3ML IN SOLN
RESPIRATORY_TRACT | Status: AC
  Administered 2023-08-29: 3 mL
  Filled 2023-08-29: qty 3

## 2023-08-29 NOTE — Discharge Instructions (Signed)
A prescription for Tamiflu was sent to your pharmacy.  Take this as prescribed.  If you experience significant diarrhea or other unwanted side effects, you can discontinue this medication.  Continue ibuprofen and Tylenol for relief of fevers, chills, aches.  Ensure the stay hydrated.  Use your inhaler as needed for chest tightness and shortness of breath.  Return to the emergency department for any new or worsening symptoms of concern.

## 2023-08-29 NOTE — ED Triage Notes (Signed)
Pt stated that he has a cough, chills, body aches, sore throat and it is hard to breathe. O2 100% in traige

## 2023-08-29 NOTE — ED Provider Notes (Signed)
[place] EMERGENCY DEPARTMENT AT [facility] Provider Note   CSN: [id number] Arrival date & time: 08/29/23  2201     History  Chief Complaint  Patient presents with   Cough    Lee Ramos is a 28 y.o. male.   Cough Associated symptoms: chills, myalgias and shortness of breath   Patient presents for flulike symptoms.  Medical history includes asthma, bipolar disorder, anxiety, depression, ADHD.  He is not aware of any sick contacts that he has been around.  Yesterday, his mother was cleaning and she did pick up a lot of dust in the air.  This morning, he woke up with cough, congestion, shortness of breath, myalgias, chills.  He has not taken ibuprofen or Tylenol today.  He did use his albuterol inhaler with minimal relief.  He has not had any nausea.  He has had good p.o. intake today.  He has been drinking fluids.     Home Medications Prior to Admission medications   Medication Sig Start Date End Date Taking? Authorizing Provider  oseltamivir (TAMIFLU) 75 MG capsule Take 1 capsule (75 mg total) by mouth every 12 (twelve) hours. 08/29/23  Yes Gloris Manchester, MD  albuterol (PROAIR HFA) 108 (90 Base) MCG/ACT inhaler Inhale 1-2 puffs into the lungs every 6 (six) hours as needed for wheezing or shortness of breath. 08/23/23   Gabriel Earing, FNP  albuterol (PROVENTIL) (2.5 MG/3ML) 0.083% nebulizer solution Take 3 mLs (2.5 mg total) by nebulization every 6 (six) hours as needed for wheezing or shortness of breath. 10/28/22   Gabriel Earing, FNP  budesonide-formoterol (SYMBICORT) 160-4.5 MCG/ACT inhaler Inhale 2 puffs into the lungs 2 (two) times daily. 08/23/23   Gabriel Earing, FNP  buPROPion (WELLBUTRIN XL) 150 MG 24 hr tablet Take 1 tablet (150 mg total) by mouth daily. 08/23/23   Gabriel Earing, FNP      Allergies    Pepto-bismol  [bismuth] and Nickel    Review of Systems   Review of Systems  Constitutional:  Positive for chills.  Respiratory:  Positive for  cough, chest tightness and shortness of breath.   Musculoskeletal:  Positive for myalgias.  All other systems reviewed and are negative.   Physical Exam Updated Vital Signs BP 119/79 (BP Location: Right Arm)   Pulse (!) 106   Temp 98.7 F (37.1 C) (Oral)   Resp 19   Ht 5\' 11"  (1.803 m)   Wt 90.7 kg   SpO2 96%   BMI 27.89 kg/m  Physical Exam Vitals and nursing note reviewed.  Constitutional:      General: He is not in acute distress.    Appearance: Normal appearance. He is well-developed. He is not ill-appearing, toxic-appearing or diaphoretic.  HENT:     Head: Normocephalic and atraumatic.     Right Ear: External ear normal.     Left Ear: External ear normal.     Nose: Nose normal.     Mouth/Throat:     Mouth: Mucous membranes are moist.  Eyes:     Extraocular Movements: Extraocular movements intact.     Conjunctiva/sclera: Conjunctivae normal.  Cardiovascular:     Rate and Rhythm: Regular rhythm. Tachycardia present.     Heart sounds: No murmur heard. Pulmonary:     Effort: Pulmonary effort is normal. No respiratory distress.     Breath sounds: Normal breath sounds. No wheezing or rales.  Abdominal:     General: There is no distension.  Palpations: Abdomen is soft.     Tenderness: There is no abdominal tenderness.  Musculoskeletal:        General: No swelling.     Cervical back: Neck supple.  Skin:    General: Skin is warm and dry.     Coloration: Skin is not jaundiced or pale.  Neurological:     General: No focal deficit present.     Mental Status: He is alert and oriented to person, place, and time.  Psychiatric:        Mood and Affect: Mood normal.        Behavior: Behavior normal.     ED Results / Procedures / Treatments   Labs (all labs ordered are listed, but only abnormal results are displayed) Labs Reviewed  RESP PANEL BY RT-PCR (RSV, FLU A&B, COVID)  RVPGX2 - Abnormal; Notable for the following components:      Result Value   Influenza A by  PCR POSITIVE (*)    All other components within normal limits    EKG None  Radiology DG Chest Portable 1 View Result Date: 08/29/2023 CLINICAL DATA:  Short of breath, cough EXAM: PORTABLE CHEST 1 VIEW COMPARISON:  09/04/2021 FINDINGS: Single frontal view of the chest demonstrates an unremarkable cardiac silhouette. No acute airspace disease, effusion, or pneumothorax. No acute bony abnormality. IMPRESSION: 1. No acute intrathoracic process. Electronically Signed   By: Sharlet Salina M.D.   On: 08/29/2023 22:59    Procedures Procedures    Medications Ordered in ED Medications  lactated ringers bolus 1,000 mL (has no administration in time range)  acetaminophen (TYLENOL) tablet 1,000 mg (has no administration in time range)  albuterol (PROVENTIL) (2.5 MG/3ML) 0.083% nebulizer solution 2.5 mg (2.5 mg Nebulization Given 08/29/23 2312)  ibuprofen (ADVIL) tablet 600 mg (600 mg Oral Given 08/29/23 2327)  ipratropium-albuterol (DUONEB) 0.5-2.5 (3) MG/3ML nebulizer solution (3 mLs  Given 08/29/23 2312)    ED Course/ Medical Decision Making/ A&P                                 Medical Decision Making Amount and/or Complexity of Data Reviewed Radiology: ordered.  Risk OTC drugs. Prescription drug management.   Patient presenting for flulike symptoms.  Onset was this morning.  Vital signs on arrival notable for mild tachycardia.  On exam, patient is well-appearing.  He does have history of asthma and has felt some chest tightness and shortness of breath today.  Lungs are quite clear to auscultation.  Given his recent symptoms, albuterol treatment was ordered.  Patient was given ibuprofen for symptomatic relief.  COVID/flu testing and chest x-ray were ordered.  Chest x-ray showed no acute findings.  Patient did test positive for influenza A.  Shared decision-making is Was Had regarding Tamiflu.  Patient Does Request Prescription.  This was Sent.  After ibuprofen, he remained tachycardic.  IV  fluids and Tylenol were ordered.  Care of patient was signed out to oncoming ED provider.        Final Clinical Impression(s) / ED Diagnoses Final diagnoses:  Influenza A    Rx / DC Orders ED Discharge Orders          Ordered    oseltamivir (TAMIFLU) 75 MG capsule  Every 12 hours        08/29/23 2355              Gloris Manchester, MD 08/29/23 2356

## 2023-09-06 ENCOUNTER — Ambulatory Visit: Payer: No Typology Code available for payment source | Admitting: Family Medicine

## 2023-09-07 ENCOUNTER — Encounter: Payer: Self-pay | Admitting: Family Medicine

## 2023-10-04 ENCOUNTER — Encounter: Payer: Self-pay | Admitting: *Deleted

## 2023-10-04 NOTE — Progress Notes (Deleted)
   Lee Ramos, male    DOB: Apr 01, 1996    MRN: [id number]   Brief patient profile:  ***  yo*** *** referred to pulmonary clinic in [place]  10/06/2023 by *** for ***      History of Present Illness  10/06/2023  Pulmonary/ 1st office eval/ [facility] / [place] [facility]  No chief complaint on file.    Dyspnea:  *** Cough: *** Sleep: *** SABA use: *** 02: *** LDSCT:***  No obvious day to day or daytime pattern/variability or assoc excess/ purulent sputum or mucus plugs or hemoptysis or cp or chest tightness, subjective wheeze or overt sinus or hb symptoms.    Also denies any obvious fluctuation of symptoms with weather or environmental changes or other aggravating or alleviating factors except as outlined above   No unusual exposure hx or h/o childhood pna/ asthma or knowledge of premature birth.  Current Allergies, Complete Past Medical History, Past Surgical History, Family History, and Social History were reviewed in [employer] record.  ROS  The following are not active complaints unless bolded Hoarseness, sore throat, dysphagia, dental problems, itching, sneezing,  nasal congestion or discharge of excess mucus or purulent secretions, ear ache,   fever, chills, sweats, unintended wt loss or wt gain, classically pleuritic or exertional cp,  orthopnea pnd or arm/hand swelling  or leg swelling, presyncope, palpitations, abdominal pain, anorexia, nausea, vomiting, diarrhea  or change in bowel habits or change in bladder habits, change in stools or change in urine, dysuria, hematuria,  rash, arthralgias, visual complaints, headache, numbness, weakness or ataxia or problems with walking or coordination,  change in mood or  memory.            Outpatient Medications Prior to Visit  Medication Sig Dispense Refill   albuterol (PROAIR HFA) 108 (90 Base) MCG/ACT inhaler Inhale 1-2 puffs into the lungs every 6 (six) hours as needed for wheezing or shortness of breath.  18 g 3   albuterol (PROVENTIL) (2.5 MG/3ML) 0.083% nebulizer solution Take 3 mLs (2.5 mg total) by nebulization every 6 (six) hours as needed for wheezing or shortness of breath. 150 mL 1   budesonide-formoterol (SYMBICORT) 160-4.5 MCG/ACT inhaler Inhale 2 puffs into the lungs 2 (two) times daily. 1 each 3   buPROPion (WELLBUTRIN XL) 150 MG 24 hr tablet Take 1 tablet (150 mg total) by mouth daily. 90 tablet 1   oseltamivir (TAMIFLU) 75 MG capsule Take 1 capsule (75 mg total) by mouth every 12 (twelve) hours. 10 capsule 0   No facility-administered medications prior to visit.    Past Medical History:  Diagnosis Date   ADHD (attention deficit hyperactivity disorder)    Allergy    Allergy    Asthma    Bipolar 1 disorder (HCC)       Objective:     There were no vitals taken for this visit.         Assessment   No problem-specific Assessment & Plan notes found for this encounter.     Sandrea Hughs, MD 10/04/2023

## 2023-10-06 ENCOUNTER — Institutional Professional Consult (permissible substitution): Payer: Medicaid Other | Admitting: Internal Medicine

## 2023-10-19 ENCOUNTER — Ambulatory Visit (INDEPENDENT_AMBULATORY_CARE_PROVIDER_SITE_OTHER): Admitting: Professional Counselor

## 2023-10-19 DIAGNOSIS — F332 Major depressive disorder, recurrent severe without psychotic features: Secondary | ICD-10-CM

## 2023-10-19 NOTE — BH Specialist Note (Signed)
 Collaborative Care Initial Assessment  Session Start time: 3:00 pm   Session End time: 4:00  Total time in minutes: 60 min   Type of Contact: Face to Face Patient consent obtained: Yes Types of Service: Collaborative care  Summary  Patient is a 28 yo male being referred to collaborative care by his pcp for anxiety and depression. Patient was engaged and cooperative during session.   Reason for referral in patient/family's own words:  "I have a lot going on"  Patient's goal for today's visit: "Somebody to talk to"  History of Present illness:   Patient is a 28 yo male with a history of depression, anxiety, bi polar and suicidal thoughts. The patient presented for a collaborative care assessment, reporting severe depressive symptoms and persistent suicidal thoughts. Three weeks ago, he experienced an intense suicidal episode, contemplating a plan to hang himself. However, he ultimately refrained from acting on these thoughts due to protective factors, particularly his son and fiance. He continues to struggle with feelings of hopelessness, despair, and fleeting suicidal thoughts.  The behavioral counselor conducted a further risk assessment and determined the patient to be high risk for suicide. Emergency resources were discussed, and the counselor provided recommendations. The patient has a significant history of trauma, psychiatric hospitalizations, and long-term struggles with anxiety and depression. He was placed in foster care and a group home at a young age, faced behavioral challenges, and had multiple psychiatric admissions, including at [facility] and [facility]. He has been intermittently medicated throughout his life, with some periods of stability. However, last May, his daughter was stillborn, and he reports never fully recovering. This grief was compounded by his father's passing in December, exacerbating his depressive symptoms.  Currently, he expresses a desire for help and  does not have an active plan or intent to harm himself. Protective factors include his six-year-old son and his supportive fiance, whom he lives with. He is seeking the right medication and treatment plan to manage his symptoms effectively. Given his high suicide risk, the behavioral counselor recommended immediate evaluation at [facility], and the psychiatric consultant reinforced this recommendation. Since the patient does not have an imminent plan or intent, emergency commitment was not deemed necessary at this time.  The patient was provided with crisis resources, including emergency room instructions, 988, and 9-1-1 for worsening symptoms. He was given the address for [facility] and agreed to seek care there. While he is not an appropriate candidate for collaborative care at this time, we will conduct a follow-up check-in to ensure his safety and connection to appropriate services.   Clinical Assessment   PHQ-9 Assessments:    10/19/2023    3:17 PM 08/23/2023    3:19 PM 10/28/2022    9:04 AM 06/19/2022    8:39 AM 05/28/2022    9:34 AM  Depression screen PHQ 2/9  Decreased Interest 1 0 0 0 0  Down, Depressed, Hopeless 2 3 0 1 1  PHQ - 2 Score 3 3 0 1 1  Altered sleeping 2 3 0 0 0  Tired, decreased energy 1 2 0 0 0  Change in appetite 1 3 3 3  0  Feeling bad or failure about yourself  3 3 0 0 1  Trouble concentrating 0 0 0 0 1  Moving slowly or fidgety/restless 0 0 0 0 0  Suicidal thoughts 3 2 0 0 2  PHQ-9 Score 13 16 3 4 5   Difficult  doing work/chores Very difficult Somewhat difficult Not difficult at all Somewhat difficult Somewhat difficult    GAD-7 Assessments:    10/19/2023    3:19 PM 08/23/2023    3:19 PM 10/28/2022    9:05 AM 06/19/2022    8:40 AM  GAD 7 : Generalized Anxiety Score  Nervous, Anxious, on Edge 1 1 0 0  Control/stop worrying 2 3 0 0  Worry too much - different things 1 1 0 0  Trouble relaxing 0 0 0  0  Restless 3 1 0 0  Easily annoyed or irritable 2 1 1  0  Afraid - awful might happen 3 0 0 0  Total GAD 7 Score 12 7 1  0  Anxiety Difficulty Very difficult Somewhat difficult Somewhat difficult Not difficult at all     Social History:  Household: Lives with fiance Marital status: Single Number of Children: 1 Employment: Aeronautical engineer Education: High school  Psychiatric Review of systems: Insomnia: No Changes in appetite: No Decreased need for sleep: No Family history of bipolar disorder: Yes Hallucinations: No   Paranoia: No    Psychotropic medications: Current medications:  Patient taking medications as prescribed:  Yes Side effects reported: Yes or No   Current medications (medication list) Current Outpatient Medications on File Prior to Visit  Medication Sig Dispense Refill   albuterol (PROAIR HFA) 108 (90 Base) MCG/ACT inhaler Inhale 1-2 puffs into the lungs every 6 (six) hours as needed for wheezing or shortness of breath. 18 g 3   albuterol (PROVENTIL) (2.5 MG/3ML) 0.083% nebulizer solution Take 3 mLs (2.5 mg total) by nebulization every 6 (six) hours as needed for wheezing or shortness of breath. 150 mL 1   budesonide-formoterol (SYMBICORT) 160-4.5 MCG/ACT inhaler Inhale 2 puffs into the lungs 2 (two) times daily. 1 each 3   buPROPion (WELLBUTRIN XL) 150 MG 24 hr tablet Take 1 tablet (150 mg total) by mouth daily. 90 tablet 1   oseltamivir (TAMIFLU) 75 MG capsule Take 1 capsule (75 mg total) by mouth every 12 (twelve) hours. 10 capsule 0   No current facility-administered medications on file prior to visit.    Psychiatric History: Past psychiatry diagnosis: Bi-polar, depression, anxiety, LD Patient currently being seen by therapist/psychiatrist: No Prior Suicide Attempts: This week he thought about hanging himself.  Past psychiatry Hospitalization(s): [facility] for behavioral issues when he was [age] years old.  Past history of violence: No  Traumatic  Experiences: History or current traumatic events (natural disaster, house fire, etc.)? no History or current physical trauma?  no History or current emotional trauma?  yes History or current sexual trauma?  no History or current domestic or intimate partner violence?  no PTSD symptoms if any traumatic experiences no  Alcohol and/or Substance Use History   Tobacco Alcohol Other substances  Current use  3-4 beers a night. 6-10 beers on weekends Smokes THC from the vape store says it helps.   Past use  Went 4 months without drinking last year. Drinking since age 68 would drink heavy at times and would moderate it for periods of time.  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  Past treatment      Flowsheet [facility] from 10/19/2023 in [facility]  Alcohol Use Disorder Identification Test Final Score (AUDIT) 13       Withdrawal Potential:   Self-harm Behaviors Risk Assessment Self-harm  risk factors: Thoughts, plan and intent.  Patient endorses recent thoughts of harming self:   [place] Suicide Severity Rating Scale:  Marland Kitchen Guns in the home: Yes it is in his fiance's car locked up   Protective factors: Has a son, supportive fiance,   Danger to Others Risk Assessment Danger to others risk factors:  Anger issues Patient endorses recent thoughts of harming others: No Dynamic Appraisal of Situational Aggression (DASA):   BH Counselor discussed emergency crisis plan with client and provided local emergency services resources.  Mental status exam:   General Appearance Luretha Murphy:  Casual Eye Contact:  Good Motor Behavior:  Normal Speech:  Normal Level of Consciousness:  Alert Mood:  Depressed Affect:  Depressed Anxiety Level:  Moderate Thought Process:  Coherent Thought Content:  WNL Perception:  Normal Judgment:  Good Insight:  Present  Diagnosis:     Goals: Increase healthy adjustment to current life circumstances   Interventions: Motivational Interviewing  Diagnosis: Severe episode of recurrent major depressive disorder, without psychotic features (HCC)   Plan:   Patient to seek immediate care at [facility]   Provided crisis resources and emergency contact information   Follow-up check-in to confirm patient's safety and treatment engagement

## 2023-10-19 NOTE — Patient Instructions (Addendum)
 Go to [facility] for Evaulation  Phone:  [phone]  Address:  [address]  [place], [place] [address]  Hours:  Open 24/7, No appointment required.  If your symptoms worsen or you have thoughts of suicide/homicide, PLEASE SEEK IMMEDIATE MEDICAL ATTENTION.  You may always call:   National Suicide Hotline: 988 or 223 548 9629 [place] Crisis Line: 541-789-2674 Crisis Recovery in [place]: (548)275-7241     These are available 24 hours a day, 7 days a week.

## 2023-10-27 ENCOUNTER — Telehealth (INDEPENDENT_AMBULATORY_CARE_PROVIDER_SITE_OTHER): Payer: Self-pay | Admitting: Professional Counselor

## 2023-10-27 DIAGNOSIS — F332 Major depressive disorder, recurrent severe without psychotic features: Secondary | ICD-10-CM

## 2023-10-27 NOTE — Addendum Note (Signed)
 Addended by: Lamar Sprinkles on: 10/27/2023 02:27 PM   Modules accepted: Level of Service

## 2023-10-27 NOTE — BH Specialist Note (Unsigned)
 Virtual Behavioral Health Treatment Plan Team Note  MRN: [id number] NAME: Lee Ramos  DATE: 10/29/23  Start time: Start Time: 0953 End time: Stop Time: 1005 Total time: Total Time in Minutes (Visit): 12  Total number of Virtual BH Treatment Team Plan encounters: 1/4  Treatment Team Attendees:   Attestation signed by Ronnie Derby, MD at 10/27/2023  2:27 PM (Updated)   Collaborative Care Psychiatric Consultant Case Review    Assessment/Provisional Diagnosis Lee Ramos is a 28 y.o. year old male with history of depression. The patient is referred for depression. Patient with recent suicidal ideations.  The patient was advised to present immediately to the emergency department for further evaluation. It appears from the chart review that the patient was not evaluated for psychiatric reasons in the ED after the counselor.  Mr. Lee Ramos will follow up with the patient again this to ensure patient continues to have no active suicidal ideations GED 7 score 12 PHQ-9 score 13 # MDD, recurrent, severe, without psychosis   Recommendation Referred to psychiatry for higher level of care [facility] specialist to follow up to ensure patient continues to have no active suicidal ideations     Thank you for your consult. We will continue to follow the patient as a bridge to seeing psychiatry. Please contact our collaborative care team for any questions or concerns.    I spent 20 minutes discussing the case with the mental health specialist, reviewing the chart, and documenting. Diagnoses: No diagnosis found.  Goals, Interventions and Follow-up Plan Goals: Increase healthy adjustment to current life circumstances Interventions: Motivational Interviewing Medication Management Recommendations:  Follow-up Plan:  Refer to high level of care. Follow up once more before signing off. IVC if still having suicidal thoughts.   History of the present illness Presenting Problem/Current Symptoms:   Patient is a 28 yo male with a history of depression, anxiety, bi polar and suicidal thoughts. The patient presented for a collaborative care assessment, reporting severe depressive symptoms and persistent suicidal thoughts. Three weeks ago, he experienced an intense suicidal episode, contemplating a plan to hang himself. However, he ultimately refrained from acting on these thoughts due to protective factors, particularly his son and fiance. He continues to struggle with feelings of hopelessness, despair, and fleeting suicidal thoughts.   The behavioral counselor conducted a further risk assessment and determined the patient to be high risk for suicide. Emergency resources were discussed, and the counselor provided recommendations. The patient has a significant history of trauma, psychiatric hospitalizations, and long-term struggles with anxiety and depression. He was placed in foster care and a group home at a young age, faced behavioral challenges, and had multiple psychiatric admissions, including at [facility] [place] and [facility]. He has been intermittently medicated throughout his life, with some periods of stability. However, last May, his daughter was stillborn, and he reports never fully recovering. This grief was compounded by his father's passing in December, exacerbating his depressive symptoms.   Currently, he expresses a desire for help and does not have an active plan or intent to harm himself. Protective factors include his six-year-old son and his supportive fiance, whom he lives with. He is seeking the right medication and treatment plan to manage his symptoms effectively. Given his high suicide risk, the behavioral counselor recommended immediate evaluation at [facility], and the psychiatric consultant reinforced this recommendation. Since the patient does not have an imminent plan or intent, emergency commitment was not deemed necessary at this time.  The  patient was provided with crisis resources, including emergency room instructions, 988, and 9-1-1 for worsening symptoms. He was given the address for [facility] and agreed to seek care there. While he is not an appropriate candidate for collaborative care at this time, we will conduct a follow-up check-in to ensure his safety and connection to appropriate services.   Screenings PHQ-9 Assessments:     10/19/2023    3:17 PM 08/23/2023    3:19 PM 10/28/2022    9:04 AM  Depression screen PHQ 2/9  Decreased Interest 1 0 0  Down, Depressed, Hopeless 2 3 0  PHQ - 2 Score 3 3 0  Altered sleeping 2 3 0  Tired, decreased energy 1 2 0  Change in appetite 1 3 3   Feeling bad or failure about yourself  3 3 0  Trouble concentrating 0 0 0  Moving slowly or fidgety/restless 0 0 0  Suicidal thoughts 3 2 0  PHQ-9 Score 13 16 3   Difficult doing work/chores Very difficult Somewhat difficult Not difficult at all   GAD-7 Assessments:     10/19/2023    3:19 PM 08/23/2023    3:19 PM 10/28/2022    9:05 AM 06/19/2022    8:40 AM  GAD 7 : Generalized Anxiety Score  Nervous, Anxious, on Edge 1 1 0 0  Control/stop worrying 2 3 0 0  Worry too much - different things 1 1 0 0  Trouble relaxing 0 0 0 0  Restless 3 1 0 0  Easily annoyed or irritable 2 1 1  0  Afraid - awful might happen 3 0 0 0  Total GAD 7 Score 12 7 1  0  Anxiety Difficulty Very difficult Somewhat difficult Somewhat difficult Not difficult at all    Past Medical History Past Medical History:  Diagnosis Date   ADHD (attention deficit hyperactivity disorder)    Allergy    Allergy    Asthma    Bipolar 1 disorder (HCC)     Vital signs: There were no vitals filed for this visit.  Allergies:  Allergies as of 10/27/2023 - Review Complete 08/29/2023  Allergen Reaction Noted   Pepto-bismol  [bismuth] Hives 06/25/2016   Nickel Rash 11/01/2012    Medication History Current medications:  Outpatient Encounter  Medications as of 10/27/2023  Medication Sig   albuterol (PROAIR HFA) 108 (90 Base) MCG/ACT inhaler Inhale 1-2 puffs into the lungs every 6 (six) hours as needed for wheezing or shortness of breath.   albuterol (PROVENTIL) (2.5 MG/3ML) 0.083% nebulizer solution Take 3 mLs (2.5 mg total) by nebulization every 6 (six) hours as needed for wheezing or shortness of breath.   budesonide-formoterol (SYMBICORT) 160-4.5 MCG/ACT inhaler Inhale 2 puffs into the lungs 2 (two) times daily.   buPROPion (WELLBUTRIN XL) 150 MG 24 hr tablet Take 1 tablet (150 mg total) by mouth daily.   oseltamivir (TAMIFLU) 75 MG capsule Take 1 capsule (75 mg total) by mouth every 12 (twelve) hours.   No facility-administered encounter medications on file as of 10/27/2023.     Scribe for Treatment Team: Reuel Boom

## 2023-10-29 ENCOUNTER — Telehealth (INDEPENDENT_AMBULATORY_CARE_PROVIDER_SITE_OTHER): Payer: Self-pay | Admitting: Professional Counselor

## 2023-10-29 DIAGNOSIS — F332 Major depressive disorder, recurrent severe without psychotic features: Secondary | ICD-10-CM

## 2023-10-29 NOTE — BH Specialist Note (Signed)
 [facility] reached out to patient to follow up on previous session. Patient did not answer. Left voicemail.

## 2023-11-05 NOTE — Progress Notes (Signed)
 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 was  in NICU x  a week as newborn, cpr due to "asthma" at  28 weeks on nebs bid since then but no longer needed about  age 28 referred to pulmonary clinic in [place]  11/08/2023 by Harlow Mares NP  for dtc asthma never eval by allergy,    Last ER  10/25/23 [facility] 10/30/23 - says was told symbicort = prn    History of Present Illness  11/08/2023  Pulmonary/ 1st office eval/ [facility] / [place] [facility]  Chief Complaint  Patient presents with   Establish Care  Dyspnea:  worked  as Scientist, 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    Also denies any obvious fluctuation of symptoms with weather or environmental changes or other aggravating or alleviating factors except as outlined above   No unusual exposure hx or h/o childhood pna or knowledge of premature birth.  Current Allergies, Complete Past Medical History, Past Surgical History, Family History, and Social History were reviewed in [employer] record.  ROS  The following are not active complaints unless bolded Hoarseness, sore throat, dysphagia, dental problems, itching, sneezing,  nasal congestion or discharge of excess mucus or purulent secretions, ear ache,   fever, chills, sweats, unintended wt loss or wt gain, classically pleuritic or exertional cp,  orthopnea pnd or arm/hand swelling  or leg swelling, presyncope,  palpitations, abdominal pain, anorexia, nausea, vomiting, diarrhea  or change in bowel habits or change in bladder habits, change in stools or change in urine, dysuria, hematuria,  rash, arthralgias, visual complaints, headache, numbness, weakness or ataxia or problems with walking or coordination,  change in mood or  memory.            Outpatient Medications Prior to Visit  Medication Sig Dispense Refill   albuterol (PROAIR HFA) 108 (90 Base) MCG/ACT inhaler Inhale 1-2 puffs into the lungs every 6 (six) hours as needed for wheezing or shortness of breath. 18 g 3   albuterol (PROVENTIL) (2.5 MG/3ML) 0.083% nebulizer solution Take 3 mLs (2.5 mg total) by nebulization every 6 (six) hours as needed for wheezing or shortness of breath. 150 mL 1   budesonide-formoterol (SYMBICORT) 160-4.5 MCG/ACT inhaler Inhale 2 puffs into the lungs 2 (two) times daily. 1 each 3   buPROPion (WELLBUTRIN XL) 150 MG 24 hr tablet Take 1 tablet (150 mg total) by mouth daily. 90 tablet 1   oseltamivir (TAMIFLU) 75 MG capsule Take 1 capsule (75 mg total) by mouth every 12 (twelve) hours. 10 capsule 0   No facility-administered medications prior to visit.    Past Medical History:  Diagnosis Date   ADHD (attention deficit hyperactivity disorder)    Allergy    Allergy    Asthma    Bipolar 1 disorder (HCC)       Objective:  BP 117/70   Pulse 80   Ht 5\' 11"  (1.803 m)   Wt 197 lb (89.4 kg)   SpO2 96%   BMI 27.48 kg/m   SpO2: 96 % RA  Amb healthy appearing wm nad    HEENT : Oropharynx  clear    Nasal turbinates nl    NECK :  without  apparent JVD/ palpable Nodes/TM    LUNGS: no acc muscle use,  Nl contour chest which is clear to A and P bilaterally without cough on insp or exp maneuvers   CV:  RRR  no s3 or murmur or increase in P2, and no edema   ABD:  soft and nontender   MS:  Gait nl   ext warm without deformities Or obvious joint restrictions  calf tenderness, cyanosis or clubbing     SKIN: warm and dry without lesions    NEURO:  alert, approp, nl sensorium with  no motor or cerebellar deficits apparent.     I personally reviewed images and agree with radiology impression as follows:  CXR:   pa and lat 10/25/23  Cardiomediastinal contours are unremarkable. The lungs are clear. There is no pneumothorax or pleural effusion. Regional skeleton demonstrates no acute osseous abnormality.        Assessment   Moderate persistent asthma with (acute) exacerbation ? Prematurely born, asthma since childhood - 11/08/2023  After extensive coaching inhaler device,  effectiveness =   75% with hfa > continue symbicort 160 but not as a prn, then use saba more appropriately as prn  Allergy screen 11/08/2023 >  Eos 0. /  IgE    Although it is reasonable to consider prn symbicort in mild well controlled asthma, he appears to me to be at risk of asthma death based on what I heard today from fm so will leave on high dose symbicort plus add singulair while waiting on labs with low threshold to add biologics if can' do better with standard max rx for asthma.   Discussed in detail all the  indications, usual  risks and alternatives  relative to the benefits with patient who agrees to proceed with Rx as outlined.            Atypical chest pain Onset 2023 worse sitting position p supper, better supine usually in RUQ but sometimes in L - w/u in ER 10/25/23 with neg cxr and d dimer and troponin   Classic subdiaphragmatic pain pattern suggests ibs:  Stereotypical, migratory from L to R and back,  with a very limited distribution of pain locations, daytime, not usually exacerbated by exercise  or coughing, worse in sitting position, frequently associated with generalized abd bloating, not as likely to be present supine due to the dome effect of the diaphragm which  is  canceled in that position. Frequently these patients have had multiple negative GI workups and CT scans.  Treatment consists of  avoiding foods that cause gas (especially boiled eggs, mexcican food but especially  beans and undercooked vegetables like  spinach and some salads)          Each maintenance medication was reviewed in detail including emphasizing most importantly the difference between maintenance and prns and under what circumstances the prns are to be triggered using an action plan format where appropriate.  Total time for H and P, chart review, counseling, reviewing hfa/ neb device(s) and generating customized AVS unique to this office visit / same day charting =  60 for multiple  refractory  respiratory/chest  symptoms of uncertain etiology with pt new to me          Sandrea Hughs, MD 11/08/2023

## 2023-11-08 ENCOUNTER — Ambulatory Visit: Payer: Medicaid Other | Admitting: Internal Medicine

## 2023-11-08 ENCOUNTER — Encounter: Payer: Self-pay | Admitting: Internal Medicine

## 2023-11-08 VITALS — BP 117/70 | HR 80 | Ht 71.0 in | Wt 197.0 lb

## 2023-11-08 DIAGNOSIS — J4541 Moderate persistent asthma with (acute) exacerbation: Secondary | ICD-10-CM | POA: Diagnosis not present

## 2023-11-08 DIAGNOSIS — R0789 Other chest pain: Secondary | ICD-10-CM | POA: Diagnosis not present

## 2023-11-08 MED ORDER — ALBUTEROL SULFATE (2.5 MG/3ML) 0.083% IN NEBU: 2.5000 mg | INHALATION_SOLUTION | RESPIRATORY_TRACT | 12 refills | Status: DC | PRN

## 2023-11-08 MED ORDER — MONTELUKAST SODIUM 10 MG PO TABS: 10.0000 mg | ORAL_TABLET | Freq: Every day | ORAL | 11 refills | Status: DC

## 2023-11-08 NOTE — Assessment & Plan Note (Addendum)
?   Prematurely born, asthma since childhood - 11/08/2023  After extensive coaching inhaler device,  effectiveness =   75% with hfa > continue symbicort 160 but not as a prn, then use saba more appropriately as prn  Allergy screen 11/08/2023 >  Eos 0. /  IgE    Although it is reasonable to consider prn symbicort in mild well controlled asthma, he appears to me to be at risk of asthma death based on what I heard today from fm so will leave on high dose symbicort plus add singulair while waiting on labs with low threshold to add biologics if can' do better with standard max rx for asthma.   Discussed in detail all the  indications, usual  risks and alternatives  relative to the benefits with patient who agrees to proceed with Rx as outlined.

## 2023-11-08 NOTE — Patient Instructions (Signed)
 Plan A = Automatic = Always=    Symbicort 160 Take 2 puffs first thing in am and then another 2 puffs about 12 hours later and singulair 10 mg each evening    Work on inhaler technique:  relax and gently blow all the way out then take a nice smooth full deep breath back in, triggering the inhaler at same time you start breathing in.  Hold breath in for at least  5 seconds if you can. Blow out symbicort 160  thru nose. Rinse and gargle with water when done.  If mouth or throat bother you at all,  try brushing teeth/gums/tongue with arm and hammer toothpaste/ make a slurry and gargle and spit out.     Plan B = Backup (to supplement plan A, not to replace it) Only use your albuterol inhaler as a rescue medication to be used if you can't catch your breath by resting or doing a relaxed purse lip breathing pattern.  - The less you use it, the better it will work when you need it. - Ok to use the inhaler up to 2 puffs  every 4 hours if you must but call for appointment if use goes up over your usual need - Don't leave home without it !!  (think of it like the spare tire for your car)    Plan C = Crisis (instead of Plan B but only if Plan B stops working) - only use your albuterol nebulizer if you first try Plan B and it fails to help > ok to use the nebulizer up to every 4 hours but if start needing it regularly call for immediate appointment  Avoid foods you know cause gas:  [place] food, beans, boiled eggs, fresh or under cooked spinach and salads x one month to see if pains resolve  Please remember to go to the lab department   for your tests - we will call you with the results when they are available.     Please schedule a follow up office visit in 4 weeks, sooner if needed  with all medications /inhalers/ solutions in hand so we can verify exactly what you are taking. This includes all medications from all doctors and over the counters

## 2023-11-08 NOTE — Assessment & Plan Note (Addendum)
 Onset 2023 worse sitting position p supper, better supine usually in RUQ but sometimes in L - w/u in ER 10/25/23 with neg cxr and d dimer and troponin   Classic subdiaphragmatic pain pattern suggests ibs:  Stereotypical, migratory from L to R and back,  with a very limited distribution of pain locations, daytime, not usually exacerbated by exercise  or coughing, worse in sitting position, frequently associated with generalized abd bloating, not as likely to be present supine due to the dome effect of the diaphragm which  is  canceled in that position. Frequently these patients have had multiple negative GI workups and CT scans.  Treatment consists of avoiding foods that cause gas (especially boiled eggs, mexcican food but especially  beans and undercooked vegetables like  spinach and some salads)          Each maintenance medication was reviewed in detail including emphasizing most importantly the difference between maintenance and prns and under what circumstances the prns are to be triggered using an action plan format where appropriate.  Total time for H and P, chart review, counseling, reviewing hfa/ neb device(s) and generating customized AVS unique to this office visit / same day charting =  60 for multiple  refractory respiratory/chest  symptoms of uncertain etiology with pt new to me

## 2023-11-09 ENCOUNTER — Ambulatory Visit: Admitting: Professional Counselor

## 2023-11-15 NOTE — Progress Notes (Deleted)
 Collaborative Care Treatment Plan  I reviewed the information and treatment recommendations from the [facility] Counselor and Psychiatric Consultant.  I do agree with the Collaborative Care Team's treatment plan, that includes the Consulting Psychiatrists' recommendations for medication management.  I will follow up with the patient's treatment as appropriate.  Time spent collaborating with team: 15 minutes

## 2023-11-16 LAB — CBC WITH DIFFERENTIAL/PLATELET
Basophils Absolute: 0 10*3/uL (ref 0.0–0.2)
Basos: 0 %
EOS (ABSOLUTE): 0.2 10*3/uL (ref 0.0–0.4)
Eos: 2 %
Hematocrit: 46.1 % (ref 37.5–51.0)
Hemoglobin: 15.9 g/dL (ref 13.0–17.7)
Immature Grans (Abs): 0 10*3/uL (ref 0.0–0.1)
Immature Granulocytes: 0 %
Lymphocytes Absolute: 2.8 10*3/uL (ref 0.7–3.1)
Lymphs: 25 %
MCH: 29.7 pg (ref 26.6–33.0)
MCHC: 34.5 g/dL (ref 31.5–35.7)
MCV: 86 fL (ref 79–97)
Monocytes Absolute: 0.6 10*3/uL (ref 0.1–0.9)
Monocytes: 6 %
Neutrophils Absolute: 7.3 10*3/uL — ABNORMAL HIGH (ref 1.4–7.0)
Neutrophils: 67 %
Platelets: 315 10*3/uL (ref 150–450)
RBC: 5.36 x10E6/uL (ref 4.14–5.80)
RDW: 12.3 % (ref 11.6–15.4)
WBC: 11 10*3/uL — ABNORMAL HIGH (ref 3.4–10.8)

## 2023-11-16 LAB — IGE: IgE (Immunoglobulin E), Serum: 493 [IU]/mL (ref 6–495)

## 2023-11-18 DIAGNOSIS — J4541 Moderate persistent asthma with (acute) exacerbation: Secondary | ICD-10-CM

## 2023-11-19 NOTE — Progress Notes (Signed)
 Collaborative Care Treatment Plan  I reviewed the information and treatment recommendations from the [facility] Counselor and Psychiatric Consultant.  I do agree with the Collaborative Care Team's treatment plan, that includes the Consulting Psychiatrists' recommendations for medication management.  I will follow up with the patient's treatment as appropriate.  Time spent collaborating with team: 15 minutes

## 2023-11-22 ENCOUNTER — Ambulatory Visit: Admitting: Professional Counselor

## 2023-11-22 DIAGNOSIS — F332 Major depressive disorder, recurrent severe without psychotic features: Secondary | ICD-10-CM

## 2023-11-22 NOTE — BH Specialist Note (Signed)
 [facility] Telephone Follow-up  MRN: [id number] NAME: Lee Ramos Date: 11/22/2023  Start time: Start Time: 0900 End time: Stop Time: 0930 Total time: Total Time in Minutes (Visit): 30 Call number: Visit Number: 4- Fourth Visit  Reason for call today:  he patient is a 28 year old male presenting for a virtual follow-up session as part of the collaborative care program. The primary purpose of this visit was to review the psychiatric consultation and provide treatment recommendations.  Upon review, the patient is deemed not appropriate for continued participation in the collaborative care program due to recent suicidality and the identified need for a higher level of care. It was recommended that the patient engage with [facility] for both psychiatric and therapeutic support.  During the session, the behavioral health care manager conducted a brief assessment of the patient's current mental health status. The patient denied any suicidal thoughts or behaviors over the past two weeks and reported significant improvement in mood and functioning. He shared that he recently secured employment, has been consistently working, and feels more productive and positive overall. He acknowledged that he had been in a difficult emotional state at the time of his initial presentation but feels much more stable now.  Despite the noted improvement, continued psychiatric support remains strongly recommended. The patient expressed agreement with this plan and is willing to follow through with services at [facility].  PHQ-9 Scores:     11/22/2023    9:03 AM 10/19/2023    3:17 PM 08/23/2023    3:19 PM 10/28/2022    9:04 AM 06/19/2022    8:39 AM  Depression screen PHQ 2/9  Decreased Interest 0 1 0 0 0  Down, Depressed, Hopeless 0 2 3 0 1  PHQ - 2 Score 0 3 3 0 1  Altered sleeping 0 2 3 0 0  Tired, decreased energy 0 1 2 0 0  Change in appetite 0 1 3 3 3   Feeling bad or failure about  yourself  0 3 3 0 0  Trouble concentrating 0 0 0 0 0  Moving slowly or fidgety/restless 0 0 0 0 0  Suicidal thoughts 0 3 2 0 0  PHQ-9 Score 0 13 16 3 4   Difficult doing work/chores Somewhat difficult Very difficult Somewhat difficult Not difficult at all Somewhat difficult   GAD-7 Scores:     11/22/2023    9:05 AM 10/19/2023    3:19 PM 08/23/2023    3:19 PM 10/28/2022    9:05 AM  GAD 7 : Generalized Anxiety Score  Nervous, Anxious, on Edge 1 1 1  0  Control/stop worrying 1 2 3  0  Worry too much - different things 1 1 1  0  Trouble relaxing 0 0 0 0  Restless 0 3 1 0  Easily annoyed or irritable 0 2 1 1   Afraid - awful might happen 1 3 0 0  Total GAD 7 Score 4 12 7 1   Anxiety Difficulty Somewhat difficult Very difficult Somewhat difficult Somewhat difficult    Stress Current stressors:  Work Sleep:  Fair Appetite:  Good Coping ability:  Fair Patient taking medications as prescribed:  Yes  Current medications:  Outpatient Encounter Medications as of 11/22/2023  Medication Sig   albuterol  (PROAIR  HFA) 108 (90 Base) MCG/ACT inhaler Inhale 1-2 puffs into the lungs every 6 (six) hours as needed for wheezing or shortness of breath.   albuterol  (PROVENTIL ) (2.5 MG/3ML) 0.083% nebulizer solution Take 3 mLs (2.5 mg total) by  nebulization every 4 (four) hours as needed.   budesonide -formoterol  (SYMBICORT ) 160-4.5 MCG/ACT inhaler Inhale 2 puffs into the lungs 2 (two) times daily.   buPROPion  (WELLBUTRIN  XL) 150 MG 24 hr tablet Take 1 tablet (150 mg total) by mouth daily.   montelukast  (SINGULAIR ) 10 MG tablet Take 1 tablet (10 mg total) by mouth at bedtime.   No facility-administered encounter medications on file as of 11/22/2023.     Self-harm Behaviors Risk Assessment Self-harm risk factors:  recent thoughts Patient endorses recent thoughts of harming self:  Denies  [place] Suicide Severity Rating Scale:  Flowsheet Row Integrated Behavioral Health from 11/22/2023 in [facility] Integrated [facility]al Health from 10/19/2023 in [facility] ED from [facility]/29/2023 in [facility]  C-SSRS RISK CATEGORY Low Risk High Risk No Risk       Danger to Others Risk Assessment Danger to others risk factors:  None Patient endorses recent thoughts of harming others:  Denies  Dynamic Appraisal of Situational Aggression (DASA):      No data to display           Substance Use Assessment Patient recently consumed alcohol:  Yes  Alcohol Use Disorder Identification Test (AUDIT):     10/19/2023    3:31 PM  Alcohol Use Disorder Test (AUDIT)  1. How often do you have a drink containing alcohol? 4  2. How many drinks containing alcohol do you have on a typical day when you are drinking? 1  3. How often do you have six or more drinks on one occasion? 3  AUDIT-C Score 8  4. How often during the last year have you found that you were not able to stop drinking once you had started? 1  6. How often during the last year have you needed a first drink in the morning to get yourself going after a heavy drinking session? 0  7. How often during the last year have you had a feeling of guilt of remorse after drinking? 0  8. How often during the last year have you been unable to remember what happened the night before because you had been drinking? 0  9. Have you or someone else been injured as a result of your drinking? 0  10. Has a relative or friend or a doctor or another health worker been concerned about your drinking or suggested you cut down? 4  Alcohol Use Disorder Identification Test Final Score (AUDIT) 13    Goals, Interventions and Follow-up Plan Goals: Increase healthy adjustment to current life circumstances Interventions: Solution-Focused Strategies and Medication Monitoring Follow-up Plan: Refer to Psychiatrist for Medication Management    Regina Capes

## 2023-11-22 NOTE — Patient Instructions (Addendum)
 Referral to [facility]

## 2023-11-23 ENCOUNTER — Ambulatory Visit: Admitting: Professional Counselor

## 2023-12-08 NOTE — Progress Notes (Deleted)
 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 despite pollen Cough: none  Sleep: HOB   30 degrees / can't breathe thru nose otherwise  SABA use: last used symbicort  160 11/05/23/ does not have saba though already called in by PCP 02: none  Sub diaphragmatic cp  R>L sitting p supper resolved supine with neg cxr and d dimer 10/09/23  Rec Plan A = Automatic = Always=    Symbicort  160 Take 2 puffs first thing in am and then another 2 puffs about 12 hours later and singulair  10 mg each evening   Work on inhaler technique:  Plan B = Backup (to supplement plan A, not to replace it) Only use your albuterol  inhaler as a rescue medication Plan C = Crisis (instead of Plan B but only if Plan B stops working) - only use your albuterol  nebulizer if you first try Plan B  Avoid foods you know cause gas:  [place] food, beans, boiled eggs, fresh or under cooked spinach and salads x one month to see if pains resolve     Please schedule a follow up office visit in 4 weeks, sooner if needed  with all medications /inhalers/ solutions in hand  - Allergy screen 11/08/2023 >  Eos 0. 2/  IgE  493  12/09/2023  f/u ov/[place] [facility]/Armoni Depass re: dtc asthma/ IBS cp  maint on ***  did *** bring meds  No chief complaint on file.   Dyspnea:  *** Cough: *** Sleeping: ***   resp cc  SABA use: *** 02:  ***  Lung cancer screening: ***   No obvious day to day or daytime variability or assoc excess/ purulent sputum or mucus plugs or hemoptysis or cp or chest tightness, subjective wheeze or overt sinus or hb symptoms.    Also denies any obvious fluctuation of symptoms with weather or environmental changes or other aggravating or alleviating factors except as outlined above   No unusual exposure hx or h/o childhood pna/ asthma or knowledge of premature birth.  Current Allergies, Complete Past Medical History, Past Surgical History, Family History, and Social History were reviewed in [employer] record.  ROS  The following are not active complaints unless bolded Hoarseness, sore throat, dysphagia, dental problems, itching, sneezing,  nasal congestion or discharge of excess mucus or purulent secretions, ear ache,   fever, chills, sweats, unintended wt loss or wt gain, classically pleuritic or exertional cp,  orthopnea pnd or arm/hand swelling  or leg swelling, presyncope, palpitations, abdominal pain, anorexia, nausea, vomiting, diarrhea  or change in bowel habits or change in bladder habits, change in stools or change in urine, dysuria, hematuria,  rash, arthralgias, visual complaints, headache, numbness, weakness or ataxia or  problems with walking or coordination,  change in mood or  memory.        No outpatient medications have been marked as taking for the 12/09/23 encounter (Appointment) with Diamond Formica, MD.            Past Medical History:  Diagnosis Date   ADHD (attention deficit hyperactivity disorder)    Allergy    Allergy    Asthma    Bipolar 1 disorder (HCC)       Objective:     Wt Readings from Last 3 Encounters:  11/08/23 197 lb (89.4 kg)  08/29/23 200 lb (90.7 kg)  08/23/23 200 lb 3.2 oz (90.8 kg)      Vital signs reviewed  12/09/2023  - Note at rest 02 sats  ***% on ***   General appearance:    ***             Assessment

## 2023-12-09 ENCOUNTER — Ambulatory Visit: Admitting: Internal Medicine

## 2023-12-09 ENCOUNTER — Encounter: Payer: Self-pay | Admitting: Internal Medicine

## 2024-01-31 ENCOUNTER — Encounter: Payer: Self-pay | Admitting: Allergy & Immunology

## 2024-01-31 ENCOUNTER — Ambulatory Visit (INDEPENDENT_AMBULATORY_CARE_PROVIDER_SITE_OTHER): Admitting: Allergy & Immunology

## 2024-01-31 ENCOUNTER — Other Ambulatory Visit: Payer: Self-pay

## 2024-01-31 VITALS — BP 122/72 | HR 67 | Temp 97.6°F | Resp 18 | Ht 69.69 in | Wt 191.8 lb

## 2024-01-31 DIAGNOSIS — J4541 Moderate persistent asthma with (acute) exacerbation: Secondary | ICD-10-CM | POA: Diagnosis not present

## 2024-01-31 DIAGNOSIS — J4540 Moderate persistent asthma, uncomplicated: Secondary | ICD-10-CM

## 2024-01-31 DIAGNOSIS — J310 Chronic rhinitis: Secondary | ICD-10-CM | POA: Diagnosis not present

## 2024-01-31 DIAGNOSIS — L508 Other urticaria: Secondary | ICD-10-CM

## 2024-01-31 MED ORDER — BUDESONIDE-FORMOTEROL FUMARATE 160-4.5 MCG/ACT IN AERO: 2.0000 | INHALATION_SPRAY | Freq: Two times a day (BID) | RESPIRATORY_TRACT | 3 refills | Status: DC

## 2024-01-31 NOTE — Addendum Note (Signed)
 Addended by: JENEL MARYLYNN GRADE on: 01/31/2024 03:19 PM   Modules accepted: Orders

## 2024-01-31 NOTE — Progress Notes (Signed)
 NEW PATIENT  Date of Service/Encounter:  01/31/24  Consult requested by: Joesph Annabella HERO, FNP   Assessment:   Moderate persistent asthma, uncomplicated  Chronic rhinitis - planning for skin testing at the next visit  Chronic urticaria - getting labs today  Singulair  non-responder  Plan/Recommendations:   1. Moderate persistent asthma, uncomplicated  - Lung testing looked great today. - We are going to go ahead and some labs to check on that white blood count. - We are also going to look into some weird causes of asthma. - Neb and teaching provided. - Daily controller medication(s): Symbicort  160/4.51mcg two puffs twice daily with spacer - Prior to physical activity: albuterol  2 puffs 10-15 minutes before physical activity. - Rescue medications: albuterol  4 puffs every 4-6 hours as needed and albuterol  nebulizer one vial every 4-6 hours as needed - Asthma control goals:  * Full participation in all desired activities (may need albuterol  before activity) * Albuterol  use two time or less a week on average (not counting use with activity) * Cough interfering with sleep two time or less a month * Oral steroids no more than once a year * No hospitalizations  2. Chronic rhinitis - Because of insurance stipulations, we cannot do skin testing on the same day as your first visit. - We are all working to fight this, but for now we need to do two separate visits.  - We will know more after we do testing at the next visit.  - The skin testing visit can be squeezed in at your convenience.  - Then we can make a more full plan to address all of your symptoms. - Be sure to stop your antihistamines for 3 days before this appointment.   3. Chronic urticaria - Hopefully we will figure out a trigger for your symptoms.  - It seems that you have mostly figured out a reason for the hives.   4. Return in about 1 week (around 02/07/2024) for SKIN TESTING (1-55 + 1-13). You can have the follow  up appointment with Dr. Iva or a Nurse Practicioner (our Nurse Practitioners are excellent and always have Physician oversight!).    This note in its entirety was forwarded to the Provider who requested this consultation.  Subjective:   Lee Ramos is a 28 y.o. male presenting today for evaluation of  Chief Complaint  Patient presents with   Asthma    Lee Ramos has a history of the following: Patient Active Problem List   Diagnosis Date Noted   Atypical chest pain 11/08/2023   Moderate persistent asthma with (acute) exacerbation 08/23/2023   Depression, recurrent (HCC) 08/23/2023   Anxiety 08/23/2023   Chronic pain of right knee 11/26/2021   Attention deficit disorder 11/01/2012   Bipolar disorder (HCC) 11/01/2012    History obtained from: chart review and patient and fiancee.  Discussed the use of AI scribe software for clinical note transcription with the patient and/or guardian, who gave verbal consent to proceed.  Lee Ramos was referred by Joesph Annabella HERO, FNP.     Lee Ramos is a 28 y.o. male presenting for an evaluation of asthma and allergies.  Asthma/Respiratory Symptom History: He is on Symbicort  two puffs twice daily. He was prescribed albuterol  solution, but no nebulizer at all. He require prednisone  a few times per year. He estimates 2-3 times per year. He has a history of asthma and is currently using Symbicort , two puffs twice a day, and a nebulizer solution. Symbicort   is effective for his symptoms, but he occasionally requires prednisone  when his breathing is severely compromised. He was hospitalized two months ago due to an asthma exacerbation where he experienced severe shortness of breath, leading to a loss of consciousness. During this episode, he required two breathing treatments in the hospital. He experiences shortness of breath, particularly in the mornings and when transitioning from indoors to outdoors. Working outdoors can exacerbate his  asthma, particularly in the heat.  He has a history of elevated white blood cell count, which was noted during a previous blood workup. He was not on prednisone  at the time of the elevated count.   Allergic Rhinitis Symptom History: He experiences seasonal allergies, characterized by sneezing and itching, for which he takes Zyrtec . Zyrtec  is effective in managing his allergy symptoms. He has previously used Singulair  but found it less effective than Zyrtec  for his allergies. He has not been skin tested for allergies in the past.   Food Allergy Symptom History: He is very particular with his foods. No foods seem to make his symptoms worse.   Skin Symptom History: He avoids Axe products and he has hives from certain deodorants. He has found a deodorant that seems to work for him. He has had allergic reactions resulting in hives, which he suspects may be related to laundry soap or body spray, although he has not identified a specific trigger. He has switched to a 'free and clear' laundry detergent to mitigate these reactions.  He has never had patch testing. He did have eczema when he was younger, but this seems to have improved.   Otherwise, there is no history of other atopic diseases, including drug allergies, stinging insect allergies, or contact dermatitis. There is no significant infectious history. Vaccinations are up to date.    Past Medical History: Patient Active Problem List   Diagnosis Date Noted   Atypical chest pain 11/08/2023   Moderate persistent asthma with (acute) exacerbation 08/23/2023   Depression, recurrent (HCC) 08/23/2023   Anxiety 08/23/2023   Chronic pain of right knee 11/26/2021   Attention deficit disorder 11/01/2012   Bipolar disorder (HCC) 11/01/2012    Medication List:  Allergies as of 01/31/2024       Reactions   Pepto-bismol  [bismuth] Hives   Nickel Rash        Medication List        Accurate as of January 31, 2024  9:16 AM. If you have any questions,  ask your nurse or doctor.          STOP taking these medications    montelukast  10 MG tablet Commonly known as: SINGULAIR  Stopped by: Lee Ramos       TAKE these medications    albuterol  108 (90 Base) MCG/ACT inhaler Commonly known as: ProAir  HFA Inhale 1-2 puffs into the lungs every 6 (six) hours as needed for wheezing or shortness of breath.   albuterol  (2.5 MG/3ML) 0.083% nebulizer solution Commonly known as: PROVENTIL  Take 3 mLs (2.5 mg total) by nebulization every 4 (four) hours as needed.   budesonide -formoterol  160-4.5 MCG/ACT inhaler Commonly known as: SYMBICORT  Inhale 2 puffs into the lungs 2 (two) times daily.   buPROPion  150 MG 24 hr tablet Commonly known as: Wellbutrin  XL Take 1 tablet (150 mg total) by mouth daily.   triamcinolone cream 0.5 % Commonly known as: KENALOG SMARTSIG:1 Topical 4 Times Daily        Birth History: non-contributory  Developmental History: non-contributory  Past Surgical History: Past  Surgical History:  Procedure Laterality Date   HAND SURGERY Right 2003     Family History: Family History  Problem Relation Age of Onset   Depression Mother    Asthma Mother    Arthritis Mother    Anxiety disorder Mother    Fibromyalgia Mother    Alcohol abuse Father    Hypertension Father    Sleep apnea Neg Hx      Social History: Darrie lives at home with his family including his fiance and his 31-year-old son.  They live in a house apartment for throughout the home.  They have electric heating and central cooling.  There is a dog inside of the home.  Dust mite covers on the bedding.  There is no tobacco exposure.  There are no fumes, chemicals, or dust exposure.  There is no HEPA filter in the home.  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  He currently presents.   Review of systems otherwise negative other than that mentioned in the HPI.    Objective:   Blood pressure 122/72, pulse 67, temperature 97.6 F  (36.4 C), temperature source Temporal, resp. rate 18, height 5' 9.69 (1.77 m), weight 191 lb 12.8 oz (87 kg), SpO2 97%. Body mass index is 27.77 kg/m.     Physical Exam Vitals reviewed.  Constitutional:      Appearance: He is well-developed.     Comments: Bearded. Talkative.   HENT:     Head: Normocephalic and atraumatic.     Right Ear: Tympanic membrane, ear canal and external ear normal. No drainage, swelling or tenderness. Tympanic membrane is not injected, scarred, erythematous, retracted or bulging.     Left Ear: Tympanic membrane, ear canal and external ear normal. No drainage, swelling or tenderness. Tympanic membrane is not injected, scarred, erythematous, retracted or bulging.     Nose: No nasal deformity, septal deviation, mucosal edema or rhinorrhea.     Right Turbinates: Enlarged, swollen and pale.     Left Turbinates: Enlarged, swollen and pale.     Right Sinus: No maxillary sinus tenderness or frontal sinus tenderness.     Left Sinus: No maxillary sinus tenderness or frontal sinus tenderness.     Mouth/Throat:     Mouth: Mucous membranes are not pale and not dry.     Pharynx: Uvula midline.   Eyes:     General: Lids are normal. Allergic shiner present.        Right eye: No discharge.        Left eye: No discharge.     Conjunctiva/sclera: Conjunctivae normal.     Right eye: Right conjunctiva is not injected. No chemosis.    Left eye: Left conjunctiva is not injected. No chemosis.    Pupils: Pupils are equal, round, and reactive to light.    Cardiovascular:     Rate and Rhythm: Normal rate and regular rhythm.     Heart sounds: Normal heart sounds.  Pulmonary:     Effort: Pulmonary effort is normal. No tachypnea, accessory muscle usage or respiratory distress.     Breath sounds: Normal breath sounds. No wheezing, rhonchi or rales.     Comments: Moving air well in all lung fields.  No increased work of breathing. Chest:     Chest wall: No tenderness.   Abdominal:     Tenderness: There is no abdominal tenderness. There is no guarding or rebound.  Lymphadenopathy:     Head:     Right side of head:  No submandibular, tonsillar or occipital adenopathy.     Left side of head: No submandibular, tonsillar or occipital adenopathy.     Cervical: No cervical adenopathy.   Skin:    Coloration: Skin is not pale.     Findings: No abrasion, erythema, petechiae or rash. Rash is not papular, urticarial or vesicular.   Neurological:     Mental Status: He is alert.   Psychiatric:        Behavior: Behavior is cooperative.      Diagnostic studies:    Spirometry: results normal (FEV1: 3.27/72%, FVC: 4.29/78%, FEV1/FVC: 76%).    Spirometry consistent with normal pattern.    Allergy Studies: deferred due to insurance stipulations that require a separate visit for testing          Lee Shaggy, MD [facility] 

## 2024-01-31 NOTE — Patient Instructions (Addendum)
 1. Moderate persistent asthma, uncomplicated  - Lung testing looked great today. - We are going to go ahead and some labs to check on that white blood count. - We are also going to look into some weird causes of asthma. - Neb and teaching provided. - Daily controller medication(s): Symbicort  160/4.62mcg two puffs twice daily with spacer - Prior to physical activity: albuterol  2 puffs 10-15 minutes before physical activity. - Rescue medications: albuterol  4 puffs every 4-6 hours as needed and albuterol  nebulizer one vial every 4-6 hours as needed - Asthma control goals:  * Full participation in all desired activities (may need albuterol  before activity) * Albuterol  use two time or less a week on average (not counting use with activity) * Cough interfering with sleep two time or less a month * Oral steroids no more than once a year * No hospitalizations  2. Chronic rhinitis - Because of insurance stipulations, we cannot do skin testing on the same day as your first visit. - We are all working to fight this, but for now we need to do two separate visits.  - We will know more after we do testing at the next visit.  - The skin testing visit can be squeezed in at your convenience.  - Then we can make a more full plan to address all of your symptoms. - Be sure to stop your antihistamines for 3 days before this appointment.   3. Chronic urticaria - Hopefully we will figure out a trigger for your symptoms.  - It seems that you have mostly figured out a reason for the hives.   4. Return in about 1 week (around 02/07/2024) for SKIN TESTING (1-55 + 1-13). You can have the follow up appointment with Dr. Iva or a Nurse Practicioner (our Nurse Practitioners are excellent and always have Physician oversight!).    Please inform us  of any Emergency Department visits, hospitalizations, or changes in symptoms. Call us  before going to the ED for breathing or allergy symptoms since we might be able to  fit you in for a sick visit. Feel free to contact us  anytime with any questions, problems, or concerns.  It was a pleasure to meet you today!  Websites that have reliable patient information: 1. American Academy of Asthma, Allergy, and Immunology: www.aaaai.org 2. Food Allergy Research and Education (FARE): foodallergy.org 3. Mothers of Asthmatics: http://www.asthmacommunitynetwork.org 4. American College of Allergy, Asthma, and Immunology: www.acaai.org      "Like" us  on Facebook and Instagram for our latest updates!      A healthy democracy works best when [employer] participate! Make sure you are registered to vote! If you have moved or changed any of your contact information, you will need to get this updated before voting! Scan the QR codes below to learn more!

## 2024-02-03 LAB — ALPHA-GAL PANEL
Allergen Lamb IgE: 2.74 kU/L — AB
Beef IgE: 3.38 kU/L — AB
IgE (Immunoglobulin E), Serum: 413 [IU]/mL (ref 6–495)
O215-IgE Alpha-Gal: 6.57 kU/L — AB
Pork IgE: 1.68 kU/L — AB

## 2024-02-03 LAB — ASPERGILLUS PRECIPITINS

## 2024-02-04 ENCOUNTER — Ambulatory Visit: Payer: Self-pay | Admitting: Allergy & Immunology

## 2024-02-04 LAB — CMP14+EGFR
ALT: 12 IU/L (ref 0–44)
AST: 15 IU/L (ref 0–40)
Albumin: 4.6 g/dL (ref 4.3–5.2)
Alkaline Phosphatase: 70 IU/L (ref 44–121)
BUN/Creatinine Ratio: 14 (ref 9–20)
BUN: 13 mg/dL (ref 6–20)
Bilirubin Total: 0.3 mg/dL (ref 0.0–1.2)
CO2: 22 mmol/L (ref 20–29)
Calcium: 9.8 mg/dL (ref 8.7–10.2)
Chloride: 105 mmol/L (ref 96–106)
Creatinine, Ser: 0.92 mg/dL (ref 0.76–1.27)
Globulin, Total: 2.7 g/dL (ref 1.5–4.5)
Glucose: 88 mg/dL (ref 70–99)
Potassium: 5.6 mmol/L — ABNORMAL HIGH (ref 3.5–5.2)
Sodium: 140 mmol/L (ref 134–144)
Total Protein: 7.3 g/dL (ref 6.0–8.5)
eGFR: 117 mL/min/{1.73_m2} (ref >59)

## 2024-02-04 LAB — CBC WITH DIFFERENTIAL/PLATELET
Basophils Absolute: 0 10*3/uL (ref 0.0–0.2)
Basos: 0 %
EOS (ABSOLUTE): 0.2 10*3/uL (ref 0.0–0.4)
Eos: 3 %
Hematocrit: 46.3 % (ref 37.5–51.0)
Hemoglobin: 15.3 g/dL (ref 13.0–17.7)
Immature Grans (Abs): 0 10*3/uL (ref 0.0–0.1)
Immature Granulocytes: 0 %
Lymphocytes Absolute: 1.6 10*3/uL (ref 0.7–3.1)
Lymphs: 25 %
MCH: 29.3 pg (ref 26.6–33.0)
MCHC: 33 g/dL (ref 31.5–35.7)
MCV: 89 fL (ref 79–97)
Monocytes Absolute: 0.4 10*3/uL (ref 0.1–0.9)
Monocytes: 7 %
Neutrophils Absolute: 4.3 10*3/uL (ref 1.4–7.0)
Neutrophils: 65 %
Platelets: 293 10*3/uL (ref 150–450)
RBC: 5.22 x10E6/uL (ref 4.14–5.80)
RDW: 12.1 % (ref 11.6–15.4)
WBC: 6.6 10*3/uL (ref 3.4–10.8)

## 2024-02-04 LAB — ANCA TITERS

## 2024-02-04 LAB — ASPERGILLUS PRECIPITINS

## 2024-02-04 LAB — ALPHA-1-ANTITRYPSIN: A-1 Antitrypsin: 129 mg/dL (ref 95–164)

## 2024-02-04 LAB — TRYPTASE: Tryptase: 9.2 ug/L (ref 2.2–13.2)

## 2024-02-04 LAB — ANTINUCLEAR ANTIBODIES, IFA

## 2024-02-04 LAB — THYROID ANTIBODIES (THYROPEROXIDASE & THYROGLOBULIN)
Thyroglobulin Antibody: <1 [IU]/mL (ref 0.0–0.9)
Thyroperoxidase Ab SerPl-aCnc: 24 [IU]/mL (ref 0–34)

## 2024-02-04 LAB — C-REACTIVE PROTEIN: CRP: <1 mg/L (ref 0–10)

## 2024-02-04 LAB — SEDIMENTATION RATE: Sed Rate: 2 mm/h (ref 0–15)

## 2024-02-10 ENCOUNTER — Other Ambulatory Visit: Payer: Self-pay | Admitting: *Deleted

## 2024-02-10 MED ORDER — EPINEPHRINE 0.3 MG/0.3ML IJ SOAJ: 0.3000 mg | INTRAMUSCULAR | 1 refills | Status: DC | PRN

## 2024-02-16 ENCOUNTER — Ambulatory Visit: Admitting: Allergy & Immunology

## 2024-02-23 ENCOUNTER — Encounter: Payer: Self-pay | Admitting: Allergy & Immunology

## 2024-02-23 ENCOUNTER — Ambulatory Visit (INDEPENDENT_AMBULATORY_CARE_PROVIDER_SITE_OTHER): Admitting: Allergy & Immunology

## 2024-02-23 VITALS — BP 130/80 | HR 63 | Temp 98.3°F | Resp 18

## 2024-02-23 DIAGNOSIS — J4540 Moderate persistent asthma, uncomplicated: Secondary | ICD-10-CM | POA: Diagnosis not present

## 2024-02-23 DIAGNOSIS — J310 Chronic rhinitis: Secondary | ICD-10-CM

## 2024-02-23 DIAGNOSIS — L508 Other urticaria: Secondary | ICD-10-CM | POA: Diagnosis not present

## 2024-02-23 NOTE — Progress Notes (Signed)
 FOLLOW UP  Date of Service/Encounter:  02/23/24   Assessment:     Plan/Recommendations:   Patient Instructions  1. Moderate persistent asthma, uncomplicated  - Lung testing looked great today. - You did have a high enough eosinophil count to get Fasenra approved (handout provided).  - This will help a lot with your breathing.  - Labs to look for difficult to control causes of hives was negative which is good news.  - Daily controller medication(s): Symbicort  160/4.69mcg two puffs twice daily with spacer - Prior to physical activity: albuterol  2 puffs 10-15 minutes before physical activity. - Rescue medications: albuterol  4 puffs every 4-6 hours as needed and albuterol  nebulizer one vial every 4-6 hours as needed - Asthma control goals:  * Full participation in all desired activities (may need albuterol  before activity) * Albuterol  use two time or less a week on average (not counting use with activity) * Cough interfering with sleep two time or less a month * Oral steroids no more than once a year * No hospitalizations  2. Chronic rhinitis - Testing was negative to the testing via the prick testing on the back. - We did not do intradermal testing since your neck was so red. - Thankfully, vital signs and physical exam were normal, aside from the redness. - We will get blood work instead, although this is not as sensitive as the intradermal testing. - This will help us  to figure you out a bit more.   3. Chronic urticaria - Lab work as normal aside from the alpha gal panel. - Risks of anaphylaxis discussed. - Keep your EpiPen  with you at all times.  - Testing to the most common foods was negative, ruling out more than 95% of all food allergies.    Component     Latest Ref Rng 01/31/2024  IgE (Immunoglobulin E), Serum     6 - 495 IU/mL 413   Class Description Allergens Comment   Pork IgE     Class III kU/L 1.68 !   Beef IgE     Class III kU/L 3.38 !   Allergen Lamb  IgE     Class III kU/L 2.74 !   O215-IgE Alpha-Gal     Class IV kU/L 6.57 !      4. Return in about 3 months (around 05/25/2024). You can have the follow up appointment with Dr. Iva or a Nurse Practicioner (our Nurse Practitioners are excellent and always have Physician oversight!).    Please inform us  of any Emergency Department visits, hospitalizations, or changes in symptoms. Call us  before going to the ED for breathing or allergy  symptoms since we might be able to fit you in for a sick visit. Feel free to contact us  anytime with any questions, problems, or concerns.  It was a pleasure to meet you today!  Websites that have reliable patient information: 1. American Academy of Asthma, Allergy , and Immunology: www.aaaai.org 2. Food Allergy  Research and Education (FARE): foodallergy.org 3. Mothers of Asthmatics: http://www.asthmacommunitynetwork.org 4. American College of Allergy , Asthma, and Immunology: www.acaai.org      "Like" us  on Facebook and Instagram for our latest updates!      A healthy democracy works best when [employer] participate! Make sure you are registered to vote! If you have moved or changed any of your contact information, you will need to get this updated before voting! Scan the QR codes below to learn more!          Airborne Adult  Perc - 02/23/24 1412     Time Antigen Placed 1400    Allergen Manufacturer Jestine    Location Back    Number of Test 55    1. Control-Buffer 50% Glycerol Negative    2. Control-Histamine 2+    3. Bahia Negative    4. [place] Negative    5. Johnson Negative    6. [place]  Blue Negative    7. Meadow Fescue Negative    8. Perennial Rye Negative    9. [place] Negative    10. Ragweed Mix Negative    11. Cocklebur Negative    12. Plantain,  English Negative    13. Baccharis Negative    14. Dog Fennel Negative    15. Russian Thistle Negative    16. Lamb's Quarters Negative    17. Sheep Sorrell Negative    18.  Rough Pigweed Negative    19. Marsh Elder, Rough Negative    20. Mugwort, Common Negative    21. Box, Elder Negative    22. Cedar, red Negative    23. Sweet Gum Negative    24. Pecan Pollen Negative    25. Pine Mix Negative    26. Walnut, Black Pollen Negative    27. Red Mulberry Negative    28. Ash Mix Negative    29. Birch Mix Negative    30. Beech American Negative    31. Cottonwood, [place]iss[place] Negative    32. Hickory, White 2+    33. Maple Mix Negative    34. Oak, [place] Mix Negative    35. Sycamore Eastern Negative    36. Alternaria Alternata Negative    37. Cladosporium Herbarum Negative    38. Aspergillus Mix Negative    39. Penicillium Mix Negative    40. Bipolaris Sorokiniana (Helminthosporium) Negative    41. Drechslera Spicifera (Curvularia) Negative    42. Mucor Plumbeus Negative    43. Fusarium Moniliforme Negative    44. Aureobasidium Pullulans (pullulara) Negative    45. Rhizopus Oryzae Negative    46. Botrytis Cinera Negative    47. Epicoccum Nigrum Negative    48. Phoma Betae Negative    49. Dust Mite Mix Negative    50. Cat Hair 10,000 BAU/ml Negative    51.  Dog Epithelia Negative    52. Mixed Feathers Negative    53. Horse Epithelia Negative    54. Cockroach, German Negative    55. Tobacco Leaf Negative          13 Food Perc - 02/23/24 1412       Test Information   Time Antigen Placed 1400    Allergen Manufacturer Jestine    Location Back    Number of allergen test 13      Food   1. Peanut Negative    2. Soybean Negative    3. Wheat Negative    4. Sesame Negative    5. Milk, Cow Negative    6. Casein Negative    7. Egg White, Chicken Negative    8. Shellfish Mix Negative    9. Fish Mix Negative    10. Cashew Negative    11. Walnut Food Negative    12. Almond Negative    13. Hazelnut Negative             Subjective:   Lee Ramos is a 28 y.o. male presenting today for follow up of  Chief Complaint  Patient presents with    Allergy  Testing  ZURICH CARRENO has a history of the following: Patient Active Problem List   Diagnosis Date Noted   Atypical chest pain 11/08/2023   Moderate persistent asthma with (acute) exacerbation 08/23/2023   Depression, recurrent (HCC) 08/23/2023   Anxiety 08/23/2023   Chronic pain of right knee 11/26/2021   Attention deficit disorder 11/01/2012   Bipolar disorder (HCC) 11/01/2012    History obtained from: chart review and patient.  Discussed the use of AI scribe software for clinical note transcription with the patient and/or guardian, who gave verbal consent to proceed.  Lee Ramos is a 28 y.o. male presenting for skin testing. He was last seen on June 2025. We could not do testing because his insurance company does not cover testing on the same day as a New Patient visit. He has been off of all antihistamines 3 days in anticipation of the testing.   At that visit, his lung testing looked great.  We obtained some labs to rule out serious causes of asthma.  We continue with Symbicort  160 mcg 2 puffs twice daily.  For his rhinitis, we decided to do allergy  testing.  He also was experiencing hives which we hope would be elucidated by the allergy  testing.  We did obtain some labs that showed a positive alpha gal panel.  All of the other labs to look for serious causes of hives were normal.  Alpha-1 antitrypsin was normal.  ANCA titers were negative.  ABPA was negative. AEC was 200.   He experiences significant itching, particularly on his shoulder, describing it as 'really bad'. Current symptoms include redness and itching, especially on his neck.  He has a history of eating red meat almost daily, despite a previous suggestion to avoid it due to a potential alpha-gal allergy . He reports no issues such as rashes or other symptoms after consuming red meat. He keeps Claritin and an Epipen  nearby as a precaution.  In June 2025, he experienced an allergic reaction after consuming pink  icing from a cake, which led to symptoms of urticaria and difficulty breathing, necessitating a visit to the emergency room. A blood test for red dye allergy  was conducted and returned negative results. Lung testing was also performed. Trip taste testing was negative as well.  Otherwise, there have been no changes to his past medical history, surgical history, family history, or social history.    Review of systems otherwise negative other than that mentioned in the HPI.    Objective:   Blood pressure 130/80, pulse 63, temperature 98.3 F (36.8 C), temperature source Temporal, resp. rate 18, SpO2 97%. There is no height or weight on file to calculate BMI.    Physical exam deferred since this was a skin testing appointment only.   Diagnostic studies:   Allergy  Studies:     Airborne Adult Perc - 02/23/24 1412     Time Antigen Placed 1400    Allergen Manufacturer Jestine    Location Back    Number of Test 55    1. Control-Buffer 50% Glycerol Negative    2. Control-Histamine 2+    3. Bahia Negative    4. [place] Negative    5. Johnson Negative    6. [place]  Blue Negative    7. Meadow Fescue Negative    8. Perennial Rye Negative    9. Naftuli Negative    10. Ragweed Mix Negative    11. Cocklebur Negative    12. Plantain,  English Negative    13. Baccharis Negative  14. Dog Fennel Negative    15. Russian Thistle Negative    16. Lamb's Quarters Negative    17. Sheep Sorrell Negative    18. Rough Pigweed Negative    19. Marsh Elder, Rough Negative    20. Mugwort, Common Negative    21. Box, Elder Negative    22. Cedar, red Negative    23. Sweet Gum Negative    24. Pecan Pollen Negative    25. Pine Mix Negative    26. Walnut, Black Pollen Negative    27. Red Mulberry Negative    28. Ash Mix Negative    29. Birch Mix Negative    30. Beech American Negative    31. Cottonwood, [place]iss[place] Negative    32. Hickory, White 2+    33. Maple Mix Negative    34. Oak, [place]  Mix Negative    35. Sycamore Eastern Negative    36. Alternaria Alternata Negative    37. Cladosporium Herbarum Negative    38. Aspergillus Mix Negative    39. Penicillium Mix Negative    40. Bipolaris Sorokiniana (Helminthosporium) Negative    41. Drechslera Spicifera (Curvularia) Negative    42. Mucor Plumbeus Negative    43. Fusarium Moniliforme Negative    44. Aureobasidium Pullulans (pullulara) Negative    45. Rhizopus Oryzae Negative    46. Botrytis Cinera Negative    47. Epicoccum Nigrum Negative    48. Phoma Betae Negative    49. Dust Mite Mix Negative    50. Cat Hair 10,000 BAU/ml Negative    51.  Dog Epithelia Negative    52. Mixed Feathers Negative    53. Horse Epithelia Negative    54. Cockroach, German Negative    55. Tobacco Leaf Negative          13 Food Perc - 02/23/24 1412       Test Information   Time Antigen Placed 1400    Allergen Manufacturer Jestine    Location Back    Number of allergen test 13      Food   1. Peanut Negative    2. Soybean Negative    3. Wheat Negative    4. Sesame Negative    5. Milk, Cow Negative    6. Casein Negative    7. Egg White, Chicken Negative    8. Shellfish Mix Negative    9. Fish Mix Negative    10. Cashew Negative    11. Walnut Food Negative    12. Almond Negative    13. Hazelnut Negative           Allergy  testing results were read and interpreted by myself, documented by clinical staff.\      Marty Shaggy, MD  [facility] 

## 2024-02-23 NOTE — Patient Instructions (Addendum)
 1. Moderate persistent asthma, uncomplicated  - Lung testing looked great today. - You did have a high enough eosinophil count to get Fasenra approved (handout provided).  - This will help a lot with your breathing.  - Labs to look for difficult to control causes of hives was negative which is good news.  - Daily controller medication(s): Symbicort  160/4.67mcg two puffs twice daily with spacer - Prior to physical activity: albuterol  2 puffs 10-15 minutes before physical activity. - Rescue medications: albuterol  4 puffs every 4-6 hours as needed and albuterol  nebulizer one vial every 4-6 hours as needed - Asthma control goals:  * Full participation in all desired activities (may need albuterol  before activity) * Albuterol  use two time or less a week on average (not counting use with activity) * Cough interfering with sleep two time or less a month * Oral steroids no more than once a year * No hospitalizations  2. Chronic rhinitis - Testing was negative to the testing via the prick testing on the back. - We did not do intradermal testing since your neck was so red. - Thankfully, vital signs and physical exam were normal, aside from the redness. - We will get blood work instead, although this is not as sensitive as the intradermal testing. - This will help us  to figure you out a bit more.   3. Chronic urticaria - Lab work as normal aside from the alpha gal panel. - Risks of anaphylaxis discussed. - Keep your EpiPen  with you at all times.  - Testing to the most common foods was negative, ruling out more than 95% of all food allergies.    Component     Latest Ref Rng 01/31/2024  IgE (Immunoglobulin E), Serum     6 - 495 IU/mL 413   Class Description Allergens Comment   Pork IgE     Class III kU/L 1.68 !   Beef IgE     Class III kU/L 3.38 !   Allergen Lamb IgE     Class III kU/L 2.74 !   O215-IgE Alpha-Gal     Class IV kU/L 6.57 !      4. Return in about 3 months (around  05/25/2024). You can have the follow up appointment with Dr. Iva or a Nurse Practicioner (our Nurse Practitioners are excellent and always have Physician oversight!).    Please inform us  of any Emergency Department visits, hospitalizations, or changes in symptoms. Call us  before going to the ED for breathing or allergy  symptoms since we might be able to fit you in for a sick visit. Feel free to contact us  anytime with any questions, problems, or concerns.  It was a pleasure to meet you today!  Websites that have reliable patient information: 1. American Academy of Asthma, Allergy , and Immunology: www.aaaai.org 2. Food Allergy  Research and Education (FARE): foodallergy.org 3. Mothers of Asthmatics: http://www.asthmacommunitynetwork.org 4. [employer] of Allergy , Asthma, and Immunology: www.acaai.org      "Like" us  on Facebook and Instagram for our latest updates!      A healthy democracy works best when [employer] participate! Make sure you are registered to vote! If you have moved or changed any of your contact information, you will need to get this updated before voting! Scan the QR codes below to learn more!          Airborne Adult Perc - 02/23/24 1412     Time Antigen Placed 1400    Allergen Manufacturer Jestine    Location  Back    Number of Test 55    1. Control-Buffer 50% Glycerol Negative    2. Control-Histamine 2+    3. Bahia Negative    4. [place] Negative    5. Johnson Negative    6. [place]  Blue Negative    7. Meadow Fescue Negative    8. Perennial Rye Negative    9. [place] Negative    10. Ragweed Mix Negative    11. Cocklebur Negative    12. Plantain,  English Negative    13. Baccharis Negative    14. Dog Fennel Negative    15. Russian Thistle Negative    16. Lamb's Quarters Negative    17. Sheep Sorrell Negative    18. Rough Pigweed Negative    19. Marsh Elder, Rough Negative    20. Mugwort, Common Negative    21. Box, Elder Negative     22. Cedar, red Negative    23. Sweet Gum Negative    24. Pecan Pollen Negative    25. Pine Mix Negative    26. Walnut, Black Pollen Negative    27. Red Mulberry Negative    28. Ash Mix Negative    29. Birch Mix Negative    30. Beech American Negative    31. Cottonwood, [place] Negative    32. Hickory, White 2+    33. Maple Mix Negative    34. Oak, [place] Mix Negative    35. Sycamore Eastern Negative    36. Alternaria Alternata Negative    37. Cladosporium Herbarum Negative    38. Aspergillus Mix Negative    39. Penicillium Mix Negative    40. Bipolaris Sorokiniana (Helminthosporium) Negative    41. Drechslera Spicifera (Curvularia) Negative    42. Mucor Plumbeus Negative    43. Fusarium Moniliforme Negative    44. Aureobasidium Pullulans (pullulara) Negative    45. Rhizopus Oryzae Negative    46. Botrytis Cinera Negative    47. Epicoccum Nigrum Negative    48. Phoma Betae Negative    49. Dust Mite Mix Negative    50. Cat Hair 10,000 BAU/ml Negative    51.  Dog Epithelia Negative    52. Mixed Feathers Negative    53. Horse Epithelia Negative    54. Cockroach, German Negative    55. Tobacco Leaf Negative          13 Food Perc - 02/23/24 1412       Test Information   Time Antigen Placed 1400    Allergen Manufacturer Jestine    Location Back    Number of allergen test 13      Food   1. Peanut Negative    2. Soybean Negative    3. Wheat Negative    4. Sesame Negative    5. Milk, Cow Negative    6. Casein Negative    7. Egg White, Chicken Negative    8. Shellfish Mix Negative    9. Fish Mix Negative    10. Cashew Negative    11. Walnut Food Negative    12. Almond Negative    13. Hazelnut Negative

## 2024-02-27 LAB — ALLERGENS W/COMP RFLX AREA 2

## 2024-02-28 LAB — PANEL 606648
E101-IgE Can f 1: <0.1 kU/L
E102-IgE Can f 2: <0.1 kU/L
E221-IgE Can f 3: 0.11 kU/L — AB
E226-IgE Can f 5: <0.1 kU/L

## 2024-02-28 LAB — PANEL 606578
E094-IgE Fel d 1: <0.1 kU/L
E220-IgE Fel d 2: <0.1 kU/L
E228-IgE Fel d 4: 0.93 kU/L — AB

## 2024-02-28 LAB — ALLERGENS W/COMP RFLX AREA 2
Bermuda Grass IgE: <0.1 kU/L
Cockroach, German IgE: 0.46 kU/L — AB
Cottonwood IgE: 0.81 kU/L — AB
D Farinae IgE: 0.29 kU/L — AB
D Pteronyssinus IgE: 0.15 kU/L — AB
E001-IgE Cat Dander: 0.61 kU/L — AB
E094-IgE Fel d 1: 1.26 kU/L — AB
E101-IgE Can f 1: 1.13 kU/L — AB
E220-IgE Fel d 2: 1.01 kU/L — AB
E221-IgE Can f 3: <0.1 kU/L
E226-IgE Can f 5: <0.1 kU/L
Elm, American IgE: 0.75 kU/L — AB
IgE (Immunoglobulin E), Serum: 426 [IU]/mL (ref 6–495)
Johnson Grass IgE: 0.46 kU/L — AB
Johnson Grass IgE: 1.07 kU/L — AB
Mouse Urine IgE: 1 kU/L — AB
Pecan, Hickory IgE: 0.58 kU/L — AB
Penicillium Chrysogen IgE: 0.65 kU/L — AB
Penicillium Chrysogen IgE: <0.1 kU/L
Pigweed, Rough IgE: 0.77 kU/L — AB
Ragweed, Short IgE: 0.78 kU/L — AB
Sheep Sorrel IgE Qn: 0.89 kU/L — AB
Timothy Grass IgE: 0.86 kU/L — AB
Timothy Grass IgE: 0.92 kU/L — AB
White Mulberry IgE: 0.56 kU/L — AB

## 2024-02-28 LAB — ALLERGEN PROFILE, MOLD
Aureobasidi Pullulans IgE: <0.1 kU/L
Candida Albicans IgE: <0.1 kU/L
M009-IgE Fusarium proliferatum: <0.1 kU/L
M014-IgE Epicoccum purpur: <0.1 kU/L
Mucor Racemosus IgE: 0.11 kU/L — AB
Phoma Betae IgE: <0.1 kU/L
Setomelanomma Rostrat: <0.1 kU/L
Stemphylium Herbarum IgE: <0.1 kU/L

## 2024-02-28 LAB — ALLERGEN COMPONENT COMMENTS

## 2024-03-08 ENCOUNTER — Ambulatory Visit: Payer: Self-pay | Admitting: Allergy & Immunology

## 2024-05-31 ENCOUNTER — Encounter: Payer: Self-pay | Admitting: Allergy & Immunology

## 2024-05-31 ENCOUNTER — Ambulatory Visit: Admitting: Allergy & Immunology

## 2024-05-31 VITALS — BP 110/74 | HR 84 | Temp 98.4°F | Wt 205.0 lb

## 2024-05-31 DIAGNOSIS — J3089 Other allergic rhinitis: Secondary | ICD-10-CM | POA: Diagnosis not present

## 2024-05-31 DIAGNOSIS — L508 Other urticaria: Secondary | ICD-10-CM

## 2024-05-31 DIAGNOSIS — J302 Other seasonal allergic rhinitis: Secondary | ICD-10-CM

## 2024-05-31 DIAGNOSIS — J4540 Moderate persistent asthma, uncomplicated: Secondary | ICD-10-CM

## 2024-05-31 MED ORDER — BUDESONIDE-FORMOTEROL FUMARATE 160-4.5 MCG/ACT IN AERO: 2.0000 | INHALATION_SPRAY | Freq: Two times a day (BID) | RESPIRATORY_TRACT | 5 refills | Status: AC

## 2024-05-31 MED ORDER — BENRALIZUMAB 30 MG/ML ~~LOC~~ SOSY
30.0000 mg | PREFILLED_SYRINGE | Freq: Once | SUBCUTANEOUS | Status: AC
  Administered 2024-05-31: 30 mg via SUBCUTANEOUS

## 2024-05-31 MED ORDER — ALBUTEROL SULFATE (2.5 MG/3ML) 0.083% IN NEBU: 2.5000 mg | INHALATION_SOLUTION | RESPIRATORY_TRACT | 1 refills | Status: AC | PRN

## 2024-05-31 MED ORDER — EPINEPHRINE 0.3 MG/0.3ML IJ SOAJ: 0.3000 mg | INTRAMUSCULAR | 1 refills | Status: AC | PRN

## 2024-05-31 MED ORDER — ALBUTEROL SULFATE HFA 108 (90 BASE) MCG/ACT IN AERS: 1.0000 | INHALATION_SPRAY | Freq: Four times a day (QID) | RESPIRATORY_TRACT | 3 refills | Status: AC | PRN

## 2024-05-31 NOTE — Progress Notes (Signed)
 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 Moderate persistent asthma, uncomplicated  - Lung testing looked fairly good although it was a bit low. - We are going to start Fasenra today. - This target eosinophils and can help his Symbicort  work better.  - Monitor the coughing at night to see if this helps.  - Daily controller medication(s): Symbicort  160/4.55mcg two puffs twice daily with spacer + Fasenra every month x 3 doses (then spacing out to every 8 weeks). - Prior to physical activity: albuterol  2 puffs 10-15 minutes before physical activity. - Rescue medications: albuterol  4 puffs every 4-6 hours as needed and albuterol  nebulizer one vial every 4-6 hours as needed - Asthma control goals:  * Full participation in all desired activities (may need albuterol  before activity) * Albuterol  use two time or less a week on average (not counting use with activity) * Cough interfering with sleep two time or less a month * Oral steroids no more than once a year * No hospitalizations  2. Perennial and seasonal allergic rhinitis  (grasses, trees, ragweed, weeds, dust mites, cat, dog, cockroach) - Testing was negative to the testing via the prick testing on the back. - We can hold off on shots since   3. Chronic urticaria - with alpha gal syndrome - Lab work as normal aside from the alpha gal panel. - Be careful with your EpiPen  with you at all times.    Component     Latest Ref Rng 01/31/2024  IgE (Immunoglobulin E), Serum     6 - 495 IU/mL 413   Class Description Allergens Comment   Pork IgE     Class III kU/L 1.68 !   Beef IgE     Class III kU/L 3.38 !   Allergen Lamb  IgE     Class III kU/L 2.74 !   O215-IgE Alpha-Gal     Class IV kU/L 6.57 !     4. Return in about 8 weeks (around 07/26/2024). You can have the follow up appointment with Dr. Iva or a Nurse Practicioner (our Nurse Practitioners are excellent and always have Physician oversight!).   Subjective:   Lee Ramos is a 28 y.o. male presenting today for follow up of  Chief Complaint  Patient presents with   Asthma    Asthma symptoms controlled, pt states no flare ups.   Allergic Rhinitis     Pt's allergy  symptoms have been stable. He said he was told he had an allergen to red meat but he has been eating w/o complication. He wants to discuss allergy  shots, he said he was given info but did not contact office to start them, and his wife is wondering if he actually needs shots or not.    Lee Ramos has a history of the following: Patient Active Problem List   Diagnosis Date Noted   Atypical chest pain 11/08/2023   Moderate persistent asthma with (acute) exacerbation 08/23/2023   Depression, recurrent 08/23/2023   Anxiety 08/23/2023   Chronic pain of right knee 11/26/2021   Attention deficit disorder 11/01/2012   Bipolar disorder (HCC) 11/01/2012    History obtained from: chart review and patient.  Discussed  the use of AI scribe software for clinical note transcription with the patient and/or guardian, who gave verbal consent to proceed.  Lee Ramos is a 28 y.o. male presenting for a follow up visit.  We last saw him in July 2025.  At that time, we did testing that was negative for everything on the back.  We wanted to do intradermal's, but the developed some redness we did blood work instead.  For his urticaria, everything was normal except all.  We gave him an EpiPen  and talked about alpha-gal syndrome.  We asthma was under good control with Symbicort  2 puffs twice daily.  His lung testing looked better, but he was still having some chronic concerns.  We did talk about  Jonell.  We did order environmental allergy  testing via the blood. This was positive to grasses, weeds, ragweed, trees, dust mite, cat, dog, cockroach.  Since last visit, he has done well.  Asthma/Respiratory Symptom History: He experiences difficulty breathing, particularly when walking long distances, such as a mile. He has not had any significant asthma attacks since his last visit. He uses Symbicort , 2 puffs twice a day, and albuterol  once or twice a week when at home. He experiences coughing at night, though not every night, and is unsure if it is due to postnasal drip or asthma. He has been using a nebulizer for breathing treatments when he gets out of breath. He is open to trying the [place].   Allergic Rhinitis Symptom History: He has a history of environmental allergies, which tend to worsen during the mowing season from March to mid-October. 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 His allergies have been manageable since his last visit with the medications alone.   Food Allergy  Symptom History: He has been eating red meat regularly despite a known allergy , without any adverse reactions, and keeps an EpiPen  at home and in his car. He has not experienced hives recently. He does not want to stop eating red meat at all.   Infection Symptom History: He mentions a history of ear infections and having had tubes placed in his ears as a child. He experiences itching, particularly on his neck, and sometimes feels chest tightness.   Otherwise, there have been no changes to his past medical history, surgical history, family history, or social history.    Review of systems otherwise negative other than that mentioned in the HPI.    Objective:   Blood pressure 110/74, pulse 84, temperature 98.4 F (36.9 C), temperature source Temporal, weight 205 lb (93 kg), SpO2 99%. Body mass index is 29.68 kg/m.    Physical Exam Vitals reviewed.  Constitutional:       Appearance: He is well-developed.     Comments: Bearded. Talkative.   HENT:     Head: Normocephalic and atraumatic.     Right Ear: Tympanic membrane, ear canal and external ear normal. No drainage, swelling or tenderness. Tympanic membrane is not injected, scarred, erythematous, retracted or bulging.     Left Ear: Tympanic membrane, ear canal and external ear normal. No drainage, swelling or tenderness. Tympanic membrane is not injected, scarred, erythematous, retracted or bulging.     Nose: No nasal deformity, septal deviation, mucosal edema or rhinorrhea.     Right Turbinates: Enlarged, swollen and pale.     Left Turbinates: Enlarged, swollen and pale.     Right Sinus: No maxillary sinus tenderness or frontal sinus tenderness.     Left Sinus: No maxillary sinus  tenderness or frontal sinus tenderness.     Comments: No polpys noted.     Mouth/Throat:     Mouth: Mucous membranes are not pale and not dry.     Pharynx: Uvula midline.  Eyes:     General: Lids are normal. Allergic shiner present.        Right eye: No discharge.        Left eye: No discharge.     Conjunctiva/sclera: Conjunctivae normal.     Right eye: Right conjunctiva is not injected. No chemosis.    Left eye: Left conjunctiva is not injected. No chemosis.    Pupils: Pupils are equal, round, and reactive to light.  Cardiovascular:     Rate and Rhythm: Normal rate and regular rhythm.     Heart sounds: Normal heart sounds.  Pulmonary:     Effort: Pulmonary effort is normal. No tachypnea, accessory muscle usage or respiratory distress.     Breath sounds: Normal breath sounds. No wheezing, rhonchi or rales.     Comments: Moving air well in all lung fields.  No increased work of breathing. Chest:     Chest wall: No tenderness.  Abdominal:     Tenderness: There is no abdominal tenderness. There is no guarding or rebound.  Lymphadenopathy:     Head:     Right side of head: No submandibular, tonsillar or occipital  adenopathy.     Left side of head: No submandibular, tonsillar or occipital adenopathy.     Cervical: No cervical adenopathy.  Skin:    Coloration: Skin is not pale.     Findings: No abrasion, erythema, petechiae or rash. Rash is not papular, urticarial or vesicular.  Neurological:     Mental Status: He is alert.  Psychiatric:        Behavior: Behavior is cooperative.      Diagnostic studies:    Spirometry: results normal (FEV1: 3.33/73%, FVC: 4.30/78%, FEV1/FVC: 77%).    Spirometry consistent with normal pattern.   Allergy  Studies: none        Marty Shaggy, MD  [facility] 

## 2024-05-31 NOTE — Patient Instructions (Addendum)
 1. Moderate persistent asthma, uncomplicated  - Lung testing looked fairly good although it was a bit low. - We are going to start Fasenra today. - This target eosinophils and can help his Symbicort  work better.  - Monitor the coughing at night to see if this helps.  - Daily controller medication(s): Symbicort  160/4.83mcg two puffs twice daily with spacer + Fasenra every month x 3 doses (then spacing out to every 8 weeks). - Prior to physical activity: albuterol  2 puffs 10-15 minutes before physical activity. - Rescue medications: albuterol  4 puffs every 4-6 hours as needed and albuterol  nebulizer one vial every 4-6 hours as needed - Asthma control goals:  * Full participation in all desired activities (may need albuterol  before activity) * Albuterol  use two time or less a week on average (not counting use with activity) * Cough interfering with sleep two time or less a month * Oral steroids no more than once a year * No hospitalizations  2. Perennial and seasonal allergic rhinitis  (grasses, trees, ragweed, weeds, dust mites, cat, dog, cockroach) - Testing was negative to the testing via the prick testing on the back. - We can hold off on shots since   3. Chronic urticaria - with alpha gal syndrome - Lab work as normal aside from the alpha gal panel. - Be careful with your EpiPen  with you at all times.    Component     Latest Ref Rng 01/31/2024  IgE (Immunoglobulin E), Serum     6 - 495 IU/mL 413   Class Description Allergens Comment   Pork IgE     Class III kU/L 1.68 !   Beef IgE     Class III kU/L 3.38 !   Allergen Lamb IgE     Class III kU/L 2.74 !   O215-IgE Alpha-Gal     Class IV kU/L 6.57 !     4. Return in about 8 weeks (around 07/26/2024). You can have the follow up appointment with Dr. Iva or a Nurse Practicioner (our Nurse Practitioners are excellent and always have Physician oversight!).    Please inform us  of any Emergency Department visits,  hospitalizations, or changes in symptoms. Call us  before going to the ED for breathing or allergy  symptoms since we might be able to fit you in for a sick visit. Feel free to contact us  anytime with any questions, problems, or concerns.  It was a pleasure to see you guys again today!  Websites that have reliable patient information: 1. American Academy of Asthma, Allergy , and Immunology: www.aaaai.org 2. Food Allergy  Research and Education (FARE): foodallergy.org 3. Mothers of Asthmatics: http://www.asthmacommunitynetwork.org 4. [employer] of Allergy , Asthma, and Immunology: www.acaai.org      "Like" us  on Facebook and Instagram for our latest updates!      A healthy democracy works best when [employer] participate! Make sure you are registered to vote! If you have moved or changed any of your contact information, you will need to get this updated before voting! Scan the QR codes below to learn more!

## 2024-06-05 ENCOUNTER — Telehealth: Payer: Self-pay | Admitting: *Deleted

## 2024-06-05 NOTE — Telephone Encounter (Signed)
 Ins has denied fasenra due to not have 150 eos within 6 weeks or 300 eos within last year. I looked but no 300 in last year. I know you started him weill probably have to wait and recheck CBC w/diff in 4 weeks to get # for approval

## 2024-06-05 NOTE — Telephone Encounter (Signed)
-----   Message from Marty Morton Shaggy sent at 05/31/2024  3:31 PM EDT ----- Fasenra start - gave sample. Consent signed.

## 2024-06-09 NOTE — Telephone Encounter (Signed)
 He had an AEC of 200 in June 2025. DANG - six weeks?!

## 2024-07-27 NOTE — Patient Instructions (Incomplete)
 Asthma Continue Symbicort  160-2 puffs twice a day with a spacer to prevent cough or mucus Continue albuterol  2 puffs every 4 hours as needed for cough or wheeze OR Instead use albuterol  0.083% solution via nebulizer one unit vial every 4 hours as needed for cough or wheeze You may use albuterol  2 puffs 5 to 15 minutes before activity to decrease cough or wheeze  Allergic rhinitis Continue allergen avoidance measures directed toward grass pollen, tree pollen weed pollen, ragweed pollen, dust mite, cat, dog, and cockroach as listed below Continue an antihistamine once a day if needed for runny nose or itch Consider Flonase 1 to 2 sprays in each nostril once a day if needed for stuffy nose Consider saline nasal rinses as needed for nasal symptoms. Use this before any medicated nasal sprays for best result  Urticaria Continue an antihistamine once or twice a day if needed for hives or itch If your symptoms re-occur, begin a journal of events that occurred for up to 6 hours before your symptoms began including foods and beverages consumed, soaps or perfumes you had contact with, and medications.   Alpha gal allergy  Continue to avoid mammalian meat.  In case of an allergic reaction, take cetirizine  10 mg once every 12-24 hours, and if life-threatening symptoms occur, inject with EpiPen  0.3 mg.  Call the clinic if this treatment plan is not working well for you.  Follow up in *** or sooner if needed.  Reducing Pollen Exposure The American Academy of Allergy , Asthma and Immunology suggests the following steps to reduce your exposure to pollen during allergy  seasons. Do not hang sheets or clothing out to dry; pollen may collect on these items. Do not mow lawns or spend time around freshly cut grass; mowing stirs up pollen. Keep windows closed at night.  Keep car windows closed while driving. Minimize morning activities outdoors, a time when pollen counts are usually at their highest. Stay indoors  as much as possible when pollen counts or humidity is high and on windy days when pollen tends to remain in the air longer. Use air conditioning when possible.  Many air conditioners have filters that trap the pollen spores. Use a HEPA room air filter to remove pollen form the indoor air you breathe.    Control of Mold Allergen Mold and fungi can grow on a variety of surfaces provided certain temperature and moisture conditions exist.  Outdoor molds grow on plants, decaying vegetation and soil.  The major outdoor mold, Alternaria and Cladosporium, are found in very high numbers during hot and dry conditions.  Generally, a late Summer - Fall peak is seen for common outdoor fungal spores.  Rain will temporarily lower outdoor mold spore count, but counts rise rapidly when the rainy period ends.  The most important indoor molds are Aspergillus and Penicillium.  Dark, humid and poorly ventilated basements are ideal sites for mold growth.  The next most common sites of mold growth are the bathroom and the kitchen.  Outdoor [employer] Use air conditioning and keep windows closed Avoid exposure to decaying vegetation. Avoid leaf raking. Avoid grain handling. Consider wearing a face mask if working in moldy areas.  Indoor Mold Control Maintain humidity below 50%. Clean washable surfaces with 5% bleach solution. Remove sources e.g. Contaminated carpets.  Control of Dust Mite Allergen Dust mites play a major role in allergic asthma and rhinitis. They occur in environments with high humidity wherever human skin is found. Dust mites absorb humidity from the atmosphere (  ie, they do not drink) and feed on organic matter (including shed human and animal skin). Dust mites are a microscopic type of insect that you cannot see with the naked eye. High levels of dust mites have been detected from mattresses, pillows, carpets, upholstered furniture, bed covers, clothes, soft toys and any woven material. The  principal allergen of the dust mite is found in its feces. A gram of dust may contain 1,000 mites and 250,000 fecal particles. Mite antigen is easily measured in the air during house cleaning activities. Dust mites do not bite and do not cause harm to humans, other than by triggering allergies/asthma.  Ways to decrease your exposure to dust mites in your home:  1. Encase mattresses, box springs and pillows with a mite-impermeable barrier or cover 2. Wash sheets, blankets and drapes weekly in hot water (130 F) with detergent and dry them in a dryer on the hot setting. 3. Have the room cleaned frequently with a vacuum cleaner and a damp dust-mop. For carpeting or rugs, vacuuming with a vacuum cleaner equipped with a high-efficiency particulate air (HEPA) filter. The dust mite allergic individual should not be in a room which is being cleaned and should wait 1 hour after cleaning before going into the room. 4. Do not sleep on upholstered furniture (eg, couches). 5. If possible removing carpeting, upholstered furniture and drapery from the home is ideal. Horizontal blinds should be eliminated in the rooms where the person spends the most time (bedroom, study, television room). Washable vinyl, roller-type shades are optimal. 6. Remove all non-washable stuffed toys from the bedroom. Wash stuffed toys weekly like sheets and blankets above. 7. Reduce indoor humidity to less than 50%. Inexpensive humidity monitors can be purchased at most hardware stores. Do not use a humidifier as can make the problem worse and are not recommended.  Control of Dog or Cat Allergen Avoidance is the best way to manage a dog or cat allergy . If you have a dog or cat and are allergic to dog or cats, consider removing the dog or cat from the home. If you have a dog or cat but dont want to find it a new home, or if your family wants a pet even though someone in the household is allergic, here are some strategies that may help keep  symptoms at bay:  Keep the pet out of your bedroom and restrict it to only a few rooms. Be advised that keeping the dog or cat in only one room will not limit the allergens to that room. Dont pet, hug or kiss the dog or cat; if you do, wash your hands with soap and water. High-efficiency particulate air (HEPA) cleaners run continuously in a bedroom or living room can reduce allergen levels over time. Regular use of a high-efficiency vacuum cleaner or a central vacuum can reduce allergen levels. Giving your dog or cat a bath at least once a week can reduce airborne allergen.  Control of Cockroach Allergen Cockroach allergen has been identified as an important cause of acute attacks of asthma, especially in urban settings.  There are fifty-five species of cockroach that exist in the [place] , however only three, the American, German and Oriental species produce allergen that can affect patients with Asthma.  Allergens can be obtained from fecal particles, egg casings and secretions from cockroaches.    Remove food sources. Reduce access to water. Seal access and entry points. Spray runways with 0.5-1% Diazinon or Chlorpyrifos Blow boric acid power under  stoves and refrigerator. Place bait stations (hydramethylnon) at feeding sites.

## 2024-07-27 NOTE — Progress Notes (Deleted)
° °  [address] [place] [address] Dept: [phone]  FOLLOW UP NOTE  Patient ID: Lee Ramos, male    DOB: 06/06/1996  Age: 27 y.o. MRN: [id number] Date of Office Visit: 07/28/2024  Assessment  Chief Complaint: No chief complaint on file.  HPI Lee Ramos is a 28 year old male who presents to the clinic for follow-up visit.  He was last seen in this clinic on 05/31/2024 by Dr. Iva for evaluation of asthma, allergic rhinitis, chronic urticaria, and alpha gal.  His last environmental allergy  skin prick testing on 02/23/2024 was borderline positive to tree pollen.  His last environmental allergy  testing by lab on 02/23/2024 was positive to grass pollen, tree pollen, ragweed pollen, weed pollen, dust mite, cat, dog, and cockroach.  Alpha gal lab on 01/31/2024 was positive to alpha gal IgE and components.  Discussed the use of AI scribe software for clinical note transcription with the patient, who gave verbal consent to proceed.  History of Present Illness      Drug Allergies:  Allergies[1]  Physical Exam: There were no vitals taken for this visit.   Physical Exam  Diagnostics:    Assessment and Plan: No diagnosis found.  No orders of the defined types were placed in this encounter.   There are no Patient Instructions on file for this visit.  No follow-ups on file.    Thank you for the opportunity to care for this patient.  Please do not hesitate to contact me with questions.  Arlean Mutter, FNP [facility]          [1]  Allergies Allergen Reactions   Pepto-Bismol  [Bismuth] Hives   Nickel Rash

## 2024-07-28 ENCOUNTER — Ambulatory Visit: Admitting: Family Medicine
